# Patient Record
Sex: Female | Born: 1975 | Race: White | Hispanic: No | Marital: Married | Smoking: Never smoker
Health system: Southern US, Community
[De-identification: ages and names within clinical notes are randomized; demographics above are authoritative.]

## PROBLEM LIST (undated history)

## (undated) DIAGNOSIS — F419 Anxiety disorder, unspecified: Secondary | ICD-10-CM

## (undated) HISTORY — PX: OTHER SURGICAL HISTORY: SHX169

## (undated) HISTORY — PX: NASAL POLYP SURGERY: SHX186

---

## 2011-04-09 ENCOUNTER — Encounter: Payer: Self-pay | Admitting: Emergency Medicine

## 2011-04-09 ENCOUNTER — Emergency Department
Admission: EM | Admit: 2011-04-09 | Discharge: 2011-04-09 | Disposition: A | Discharge status: Home / Self Care | Payer: BC Managed Care – PPO | Source: Home / Self Care | Attending: Emergency Medicine | Admitting: Emergency Medicine

## 2011-04-09 DIAGNOSIS — J329 Chronic sinusitis, unspecified: Secondary | ICD-10-CM

## 2011-04-09 HISTORY — DX: Anxiety disorder, unspecified: F41.9

## 2011-04-09 LAB — POCT RAPID STREP A (OFFICE): Rapid Strep A Screen: NEGATIVE

## 2011-04-09 MED ORDER — AMOXICILLIN 875 MG PO TABS: 875.0000 mg | ORAL_TABLET | Freq: Two times a day (BID) | ORAL | Status: AC

## 2011-04-09 NOTE — ED Provider Notes (Signed)
History     CSN: [id number]  Arrival date & time 04/09/11  1228   First MD Initiated Contact with Patient 04/09/11 1307      Chief Complaint  Patient presents with  . Nasal Congestion    (Consider location/radiation/quality/duration/timing/severity/associated sxs/prior treatment) HPI Paula Tucker is a 36 y.o. female who complains of onset of cold symptoms for 7 days.  The symptoms are constant and mild-moderate in severity.  She already has baseline allergies, however she states she is getting worse in this does not feel like allergies but instead feels like a sinus infection.  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 + sore throat + cough + hoarseness No pleuritic pain No wheezing + nasal congestion + post-nasal drainage ++ sinus pain/pressure No chest congestion No itchy/red eyes No earache No hemoptysis No SOB +/- chills/sweats No fever No nausea No vomiting No abdominal pain No diarrhea No skin rashes No fatigue No myalgias + headache    Past Medical History  Diagnosis Date  . Asthma   . Anxiety     Past Surgical History  Procedure Date  . Cyst on neck     Family History  Problem Relation Age of Onset  . Heart failure Mother     History  Substance Use Topics  . Smoking status: Never Smoker   . Smokeless tobacco: Not on file  . Alcohol Use: No      Review of Systems  All other systems reviewed and are negative.    Allergies  Review of patient's allergies indicates no known allergies.  Home Medications   Current Outpatient Rx  Name Route Sig Dispense Refill  . CLONAZEPAM 0.5 MG PO TABS Oral Take 0.5 mg by mouth 2 (two) times daily as needed.    Marland Kitchen FLUTICASONE-SALMETEROL 100-50 MCG/DOSE IN AEPB Inhalation Inhale 1 puff into the lungs every 12 (twelve) hours.    Kathrynn Running ESTRAD TRIPHASIC 0.5/0.75/1-35 MG-MCG PO TABS Oral Take 1 tablet by mouth daily.    Marland Kitchen PAROXETINE HCL 20 MG PO TABS Oral Take 20 mg  by mouth every morning.    Marland Kitchen AMOXICILLIN 875 MG PO TABS Oral Take 1 tablet (875 mg total) by mouth 2 (two) times daily. 16 tablet 0    BP 131/88  Temp(Src) 99 F (37.2 C) (Oral)  Resp 18  Ht 5\' 1"  (1.549 m)  Wt 190 lb (86.183 kg)  BMI 35.90 kg/m2  SpO2 98%  Physical Exam  Nursing note and vitals reviewed. Constitutional: He is oriented to person, place, and time. He appears well-developed and well-nourished.  HENT:  Head: Normocephalic and atraumatic.  Right Ear: Tympanic membrane, external ear and ear canal normal.  Left Ear: Tympanic membrane, external ear and ear canal normal.  Nose: Mucosal edema and rhinorrhea present. Right sinus exhibits maxillary sinus tenderness. Left sinus exhibits maxillary sinus tenderness.  Mouth/Throat: Posterior oropharyngeal erythema (mild clear PND) present. No oropharyngeal exudate or posterior oropharyngeal edema.  Eyes: No scleral icterus.  Neck: Neck supple.  Cardiovascular: Regular rhythm and normal heart sounds.   Pulmonary/Chest: Effort normal and breath sounds normal. No respiratory distress.  Neurological: He is alert and oriented to person, place, and time.  Skin: Skin is warm and dry.  Psychiatric: He has a normal mood and affect. His speech is normal.    ED Course  Procedures (including critical care time)   Labs Reviewed  POCT RAPID STREP A (OFFICE)   No results  found.   1. Sinusitis       MDM  1)  Take the prescribed antibiotic as instructed. 2)  Use nasal saline solution (over the counter) at least 3 times a day. 3)  Use over the counter decongestants like Zyrtec-D every 12 hours as needed to help with congestion.  If you have hypertension, do not take medicines with sudafed.  4)  Can take tylenol every 6 hours or motrin every 8 hours for pain or fever. 5)  Follow up with your primary doctor if no improvement in 5-7 days, sooner if increasing pain, fever, or new symptoms.     Marlaine Hind, MD 04/09/11 1330

## 2011-04-09 NOTE — ED Notes (Signed)
Sinus congestion with fever, fatigue, cough and sore throat.

## 2011-06-11 ENCOUNTER — Ambulatory Visit (INDEPENDENT_AMBULATORY_CARE_PROVIDER_SITE_OTHER): Payer: BC Managed Care – PPO | Admitting: Physician Assistant

## 2011-06-11 ENCOUNTER — Encounter: Payer: Self-pay | Admitting: Physician Assistant

## 2011-06-11 VITALS — BP 140/94 | HR 99 | Temp 98.5°F | Wt 192.0 lb

## 2011-06-11 DIAGNOSIS — F341 Dysthymic disorder: Secondary | ICD-10-CM

## 2011-06-11 DIAGNOSIS — R635 Abnormal weight gain: Secondary | ICD-10-CM

## 2011-06-11 DIAGNOSIS — Z13220 Encounter for screening for lipoid disorders: Secondary | ICD-10-CM

## 2011-06-11 DIAGNOSIS — R030 Elevated blood-pressure reading, without diagnosis of hypertension: Secondary | ICD-10-CM

## 2011-06-11 DIAGNOSIS — J45909 Unspecified asthma, uncomplicated: Secondary | ICD-10-CM

## 2011-06-11 DIAGNOSIS — F419 Anxiety disorder, unspecified: Secondary | ICD-10-CM

## 2011-06-11 DIAGNOSIS — Z131 Encounter for screening for diabetes mellitus: Secondary | ICD-10-CM

## 2011-06-11 MED ORDER — FLUTICASONE-SALMETEROL 100-50 MCG/DOSE IN AEPB: 1.0000 | INHALATION_SPRAY | Freq: Two times a day (BID) | RESPIRATORY_TRACT | Status: DC

## 2011-06-11 MED ORDER — ALBUTEROL SULFATE HFA 108 (90 BASE) MCG/ACT IN AERS: 2.0000 | INHALATION_SPRAY | Freq: Four times a day (QID) | RESPIRATORY_TRACT | Status: DC | PRN

## 2011-06-11 NOTE — Patient Instructions (Addendum)
Will watch blood pressure. Start exercising goal of a week making sure that you sweat and get your heart rate up. Will recheck in 1-2 months with CPE and blood pressure recheck. Will get labs now. Fasting at least 8 hours.

## 2011-06-11 NOTE — Progress Notes (Signed)
  Subjective:    Patient ID: Paula Tucker, female    DOB: 02/05/1975, 36 y.o.   MRN: [id number]  HPI Patient presents to the clinic to establish care and manage asthma. PMH reviewed. She does have history of one seizure but it was never determined the type. She has not had seizure since first episode.  She does have anxiety and depression but her psychiatrist has been tapering her off paxil and klonapin. She is feeling really good. Needs CPE and blood work. Will give lab order form today. Not exercising but trying to make better diet choices and still feels like no changes yield in weight loss. Has not felt fatigue or have any heat or cold intolerances.   NO problems with blood pressure in the past. Denies CP, palpitations, or headaches.   Not been taking advair because have not had prescription. She has really struggled to breathe the last few weeks. Advair was helping. Symptoms without advair were almost daily and worse at night when laying down. Not had testing recently. She been wheezing and lot of SOB. Has not had rescue inhaler to use. She has been using OTC asthma relief a epinephrine blend to help with breathing it has helped some.       Review of Systems     Objective:   Physical Exam  Constitutional: He is oriented to person, place, and time. He appears well-developed and well-nourished.  HENT:  Head: Normocephalic and atraumatic.  Cardiovascular: Normal rate, regular rhythm and normal heart sounds.   Pulmonary/Chest: Effort normal.       Wheezing heard at base of left lung. Normal right lung.   Neurological: He is alert and oriented to person, place, and time.  Skin: Skin is warm and dry.  Psychiatric: He has a normal mood and affect. His behavior is normal.          Assessment & Plan:  Asthma- Not been taking advair. Refilled advair and advised patient to start taking as directed twice a day. Talked about need for spironmetry. Will need to make separate appt for that. Gave  rx for albuterol inhaler to use as needed 2puffs. Discuss proper inhaler technique. Will recheck symptom control at CPE in 1-2 months.   Elevated blood pressure- Will continue to watch blood pressure. Not a history of being this elevated. Encouraged patient to watch salt intake and consider exercise to decrease blood pressure. Will recheck at CPE in 1-2 months.   Anxiety and depression- doing well tapering off. Will let psychiatrist manage taper. Informed to call if any changes with mood.   Weight gain- will get TSH. Start a goal of exercising a week. Encouraged good choices with food. Eliminate beverages from calories. Going off Paxil may actually help you lose weight.   Gave form to get labs drawn for CPE. Discussed needed to be fasting for 8 hours.

## 2011-06-14 DIAGNOSIS — F329 Major depressive disorder, single episode, unspecified: Secondary | ICD-10-CM | POA: Insufficient documentation

## 2011-06-14 DIAGNOSIS — J45909 Unspecified asthma, uncomplicated: Secondary | ICD-10-CM | POA: Insufficient documentation

## 2011-07-09 ENCOUNTER — Encounter: Payer: BC Managed Care – PPO | Admitting: Physician Assistant

## 2011-07-27 LAB — LIPID PANEL
HDL: 53 mg/dL (ref >39)
LDL Cholesterol: 88 mg/dL (ref 0–99)
Total CHOL/HDL Ratio: 3.2 Ratio
VLDL: 26 mg/dL (ref 0–40)

## 2011-07-27 LAB — COMPLETE METABOLIC PANEL WITH GFR
ALT: 14 U/L (ref 0–53)
AST: 15 U/L (ref 0–37)
Albumin: 3.8 g/dL (ref 3.5–5.2)
Alkaline Phosphatase: 58 U/L (ref 39–117)
GFR, Est Non African American: >89 mL/min
Potassium: 4.5 mEq/L (ref 3.5–5.3)
Sodium: 139 mEq/L (ref 135–145)
Total Bilirubin: 0.2 mg/dL — ABNORMAL LOW (ref 0.3–1.2)
Total Protein: 7.2 g/dL (ref 6.0–8.3)

## 2011-07-28 ENCOUNTER — Encounter: Payer: Self-pay | Admitting: Physician Assistant

## 2011-07-28 ENCOUNTER — Ambulatory Visit (INDEPENDENT_AMBULATORY_CARE_PROVIDER_SITE_OTHER): Payer: BC Managed Care – PPO | Admitting: Physician Assistant

## 2011-07-28 VITALS — BP 147/92 | HR 97 | Ht 61.0 in | Wt 194.0 lb

## 2011-07-28 DIAGNOSIS — F419 Anxiety disorder, unspecified: Secondary | ICD-10-CM

## 2011-07-28 DIAGNOSIS — I10 Essential (primary) hypertension: Secondary | ICD-10-CM

## 2011-07-28 DIAGNOSIS — Z0000 Encounter for general adult medical examination without abnormal findings: Secondary | ICD-10-CM

## 2011-07-28 DIAGNOSIS — Z23 Encounter for immunization: Secondary | ICD-10-CM

## 2011-07-28 MED ORDER — LISINOPRIL-HYDROCHLOROTHIAZIDE 10-12.5 MG PO TABS: 1.0000 | ORAL_TABLET | Freq: Every day | ORAL | Status: DC

## 2011-07-28 NOTE — Patient Instructions (Addendum)
Recommend Calcium 500mg  twice day or 4 servings.  Exercise a week. Balanced diet. Follow up in 1 year.  Tdap was given.   Start lisinopril 10/12.5 once daily. Follow up in 1 month.  Will need spirometry sometime this year.

## 2011-07-29 DIAGNOSIS — I10 Essential (primary) hypertension: Secondary | ICD-10-CM | POA: Insufficient documentation

## 2011-07-29 NOTE — Addendum Note (Signed)
Addended by: Jomarie Longs on: 07/29/2011 11:27 PM   Modules accepted: Orders

## 2011-07-29 NOTE — Progress Notes (Addendum)
  Subjective:     Paula Tucker is a 36 y.o. female and is here for a comprehensive physical exam. The patient reports no problems. She is slowly tapering off paxil under the cousel of neurologist. She would like to be referred somewhere for CBT so that she can make sure she is prepare for any worsening anxiety while coming off medication. Pt feels great today.  History   Social History  . Marital Status: Married    Spouse Name: N/A    Number of Children: N/A  . Years of Education: N/A   Occupational History  . Not on file.   Social History Main Topics  . Smoking status: Never Smoker   . Smokeless tobacco: Not on file  . Alcohol Use: No  . Drug Use: No  . Sexually Active: Yes    Birth Control/ Protection: Pill   Other Topics Concern  . Not on file   Social History Narrative  . No narrative on file   Health Maintenance  Topic Date Due  . Pap Smear  11/15/1995  . Influenza Vaccine  10/12/2011  . Tetanus/tdap  07/27/2021    The following portions of the patient's history were reviewed and updated as appropriate: allergies, current medications, past family history, past medical history, past social history, past surgical history and problem list.  Review of Systems A comprehensive review of systems was negative.   Objective:    BP 147/92  Pulse 97  Ht 5\' 1"  (1.549 m)  Wt 194 lb (87.998 kg)  BMI 36.66 kg/m2  SpO2 97%  LMP 07/04/2011 General appearance: alert, cooperative, appears stated age and mildly obese Head: Normocephalic, without obvious abnormality, atraumatic Eyes: conjunctivae/corneas clear. PERRL, EOM's intact. Fundi benign. Ears: normal TM's and external ear canals both ears Nose: Nares normal. Septum midline. Mucosa normal. No drainage or sinus tenderness. Throat: lips, mucosa, and tongue normal; teeth and gums normal Neck: no adenopathy, no carotid bruit, no JVD, supple, symmetrical, trachea midline and thyroid not enlarged, symmetric, no  tenderness/mass/nodules Back: symmetric, no curvature. ROM normal. No CVA tenderness. Lungs: clear to auscultation bilaterally Heart: regular rate and rhythm, S1, S2 normal, no murmur, click, rub or gallop Abdomen: soft, non-tender; bowel sounds normal; no masses,  no organomegaly Extremities: extremities normal, atraumatic, no cyanosis or edema Pulses: 2+ and symmetric Skin: Skin color, texture, turgor normal. No rashes or lesions Lymph nodes: Cervical, supraclavicular, and axillary nodes normal. Neurologic: Grossly normal    Assessment:    Healthy female exam.    Plan:    CPE- Recommend Calcium 500mg  twice day or 4 servings.  Exercise a week. Balanced diet. Follow up in 1 year.  Tdap was given. Vaccines up to date.  Start lisinopril 10/12.5 once daily. Follow up in 1 month.  Will need spirometry sometime this year. Continue on advair daily.   Will refer to CBT for anxiety and depression. See After Visit Summary for Counseling Recommendations

## 2011-08-09 ENCOUNTER — Ambulatory Visit (HOSPITAL_COMMUNITY): Payer: BC Managed Care – PPO | Admitting: Psychology

## 2011-08-20 ENCOUNTER — Encounter: Payer: Self-pay | Admitting: Physician Assistant

## 2011-08-20 ENCOUNTER — Ambulatory Visit (INDEPENDENT_AMBULATORY_CARE_PROVIDER_SITE_OTHER): Payer: BC Managed Care – PPO | Admitting: Physician Assistant

## 2011-08-20 VITALS — BP 104/74 | HR 107 | Ht 61.0 in | Wt 193.0 lb

## 2011-08-20 DIAGNOSIS — J45909 Unspecified asthma, uncomplicated: Secondary | ICD-10-CM

## 2011-08-20 DIAGNOSIS — I10 Essential (primary) hypertension: Secondary | ICD-10-CM

## 2011-08-20 MED ORDER — LISINOPRIL 10 MG PO TABS: 10.0000 mg | ORAL_TABLET | Freq: Every day | ORAL | Status: DC

## 2011-08-20 NOTE — Patient Instructions (Addendum)
F/U 6 months

## 2011-08-23 NOTE — Progress Notes (Signed)
  Subjective:    Patient ID: Paula Tucker, female    DOB: 1975-09-27, 36 y.o.   MRN: [id number]  HPI Patient comes in today for spironmetry since we have no record in 2+years for her asthma. She denies any worsening of asthma symptoms today. She rarely uses albuterol inhaler maybe once a month. She is using Advair every day and doing well.   She is almost off her paxil for anxiety and depression that is being managed by neurologist. She is doing well today and feels like symptoms are under control. She still has the occasional overwhelmed feeling but she is able to get through it with behavioral techniques.   Pt is taking lisinopril/HCTZ daily since recent dx of hypertension. Today her blood pressure is low. She reports feeling dizzy sometimes when she stands. She is trying to exercise more and make better dietary choices. She denies any side effects of Lisinopril/HCTZ.    Review of Systems     Objective:   Physical Exam  Constitutional: She is oriented to person, place, and time. She appears well-developed and well-nourished.  HENT:  Head: Normocephalic and atraumatic.  Cardiovascular: Normal rate, regular rhythm, normal heart sounds and intact distal pulses.   Pulmonary/Chest: Effort normal and breath sounds normal. She has no wheezes.  Neurological: She is alert and oriented to person, place, and time.  Skin: Skin is warm and dry.  Psychiatric: She has a normal mood and affect. Her behavior is normal.          Assessment & Plan:  Asthma- spironmetry was done today and was normal before but FVC improved by more than 12% and thus is significant after medication. Will leave on same treatment. Follow up as needed or in 6 months for recheck.  Hypertension- BP is very low today. Will change to lisinopril 10mg  daily and remove diuretic. Patient is to monitor her blood pressure at home and make sure it is staying above 100/75 and below 140/90. Will follow up in 6 months. Also discussed if  she still remained dizzy while taking bp medication we may to decrease dose again and she would need to call into office.

## 2011-08-30 ENCOUNTER — Ambulatory Visit: Payer: BC Managed Care – PPO | Admitting: Physician Assistant

## 2011-08-30 ENCOUNTER — Other Ambulatory Visit: Payer: Self-pay | Admitting: Physician Assistant

## 2011-09-07 ENCOUNTER — Encounter: Payer: Self-pay | Admitting: Physician Assistant

## 2011-09-14 ENCOUNTER — Other Ambulatory Visit: Payer: Self-pay | Admitting: *Deleted

## 2011-09-14 MED ORDER — FLUTICASONE-SALMETEROL 100-50 MCG/DOSE IN AEPB: 1.0000 | INHALATION_SPRAY | Freq: Two times a day (BID) | RESPIRATORY_TRACT | Status: DC

## 2011-12-13 ENCOUNTER — Other Ambulatory Visit: Payer: Self-pay | Admitting: Physician Assistant

## 2012-01-28 ENCOUNTER — Encounter: Payer: Self-pay | Admitting: Physician Assistant

## 2012-01-28 ENCOUNTER — Ambulatory Visit (INDEPENDENT_AMBULATORY_CARE_PROVIDER_SITE_OTHER): Payer: BC Managed Care – PPO | Admitting: Physician Assistant

## 2012-01-28 VITALS — BP 152/84 | HR 103 | Wt 190.0 lb

## 2012-01-28 DIAGNOSIS — R51 Headache: Secondary | ICD-10-CM

## 2012-01-28 DIAGNOSIS — H538 Other visual disturbances: Secondary | ICD-10-CM

## 2012-01-28 MED ORDER — PAROXETINE HCL 20 MG PO TABS: 20.0000 mg | ORAL_TABLET | ORAL | Status: DC

## 2012-01-28 MED ORDER — GABAPENTIN 100 MG PO CAPS: ORAL_CAPSULE | ORAL | Status: DC

## 2012-01-28 MED ORDER — SUMATRIPTAN SUCCINATE 100 MG PO TABS: 100.0000 mg | ORAL_TABLET | ORAL | Status: DC | PRN

## 2012-01-28 MED ORDER — CLONAZEPAM 0.5 MG PO TABS: 0.5000 mg | ORAL_TABLET | Freq: Two times a day (BID) | ORAL | Status: DC | PRN

## 2012-01-28 MED ORDER — FLUTICASONE PROPIONATE 50 MCG/ACT NA SUSP: 2.0000 | Freq: Every day | NASAL | Status: DC

## 2012-01-28 NOTE — Patient Instructions (Addendum)
Keep an eye on blood pressure if goes above 140/90 give office a call.   Go back up on paxil.   Start gabapentin at night can take up to 2 tabs.   Imitrex at onset of headache or vision blurrnes.

## 2012-01-28 NOTE — Progress Notes (Signed)
Subjective:    Patient ID: Paula Tucker, female    DOB: 02-10-1975, 37 y.o.   MRN: [id number]  HPI  Patient is 37 yo female who presents to the clinic to follow up from Hospital. She was sent to hospital from her work because she reported headache, dizziness, speech slurring, and mental fogginess. She is a Runner, broadcasting/film/video and describe that it started with her vision being a little blurred and then an headache started. She took advil and she just felt "out of it". She was not making sense verbally. Her speech was slurred and she felt weak all over. Headache was around circumference of head and at the temples. Pain scale was 4/10. She denies any N/VD, ear pain, tinnitus. She denies any muscle weakness, LOC, or passing out. 2 weeks ago her vision was blurred again while reading but stopped reading and symptoms did not progress. In November the exact same thing happened but lasted a shorter peroid of time about 30 minutes. Labs in ER: MRA of head normal. CBC normal. Urine normal. SCr a alittle low and gave fluids for dehydration.  ER discharged once feeling better after IV and benadryl.  Pt concerned because she is trying to come off paxil and klonapin. She is currently on 1/2 tab of both. She has been on this dose for over 3 months at this point. She is feeling very anxious. She is having hard time sleeping and her legs twich and jump at night. Not when falling asleep but also in early morning hours. She leaves in a state of on and off again congestion per pt. Dose not take anything for it.     Review of Systems  Constitutional: Negative for fever, chills, appetite change, fatigue and unexpected weight change.  HENT: Positive for congestion.   Eyes: Negative.   Respiratory: Negative.   Cardiovascular: Negative.   Gastrointestinal: Negative.   Musculoskeletal: Negative.   Skin: Negative.   Hematological: Negative.        Objective:   Physical Exam  Constitutional: She is oriented to person, place, and  time. She appears well-developed and well-nourished.  HENT:  Head: Normocephalic and atraumatic.  Right Ear: External ear normal.  Left Ear: External ear normal.  Nose: Nose normal.  Mouth/Throat: Oropharynx is clear and moist.       TM's clear.  Eyes: Conjunctivae normal are normal.  Neck: Normal range of motion. Neck supple.  Cardiovascular: Normal rate, regular rhythm and normal heart sounds.   Pulmonary/Chest: Effort normal and breath sounds normal. She has no wheezes.  Musculoskeletal:       Strength of upper and lower extremities 5/5.   Lymphadenopathy:    She has no cervical adenopathy.  Neurological: She is alert and oriented to person, place, and time. She has normal reflexes. No cranial nerve deficit. Coordination normal.       Negative Rhombergs sign.  Skin: Skin is warm and dry.  Psychiatric: She has a normal mood and affect. Her behavior is normal.          Assessment & Plan:  Headaches/Dizziness/speech slurring/HTN- Reassured patient that I think this is headaches causing these symptoms. Pt has been seen by neurologist in the past and discharged recently. She does not want to go on medication to prevent headaches because she feels like they do not happen frequently enough. Will give imitrex to start at headache/vision blurrness onset and then once more 2 hours later. Will refer to neurology I think pt should be evaulated for MS  or Lupus. Recheck BP and decreased to 132/80. Continue to monitor to see if we need to increase medication. Will recheck in 4 weeks. Gave flonase to see if helps with headache if due to head congestion. Consider taking zyrtec daily.   Lower extremity leg twicthing- Try neurotin 100mg  at night. May increase to 2 tabs if need too. Make sure not dehydrated. Drink lots of fluids.   Anxiety and Depression- Increase paxil up to 20mg  dose. Keep klonapin as 1/2 tab once a day only as needed. Reducated pt to take when need not as preventative. Follow up in  4 weeks.     Spent 30 minutes with patient and greater than 50 percent of time spent counseling about plan of action with neurologist and treatment plan for anxiety and depression.

## 2012-02-02 ENCOUNTER — Encounter: Payer: Self-pay | Admitting: *Deleted

## 2012-02-21 ENCOUNTER — Other Ambulatory Visit: Payer: Self-pay | Admitting: *Deleted

## 2012-02-21 MED ORDER — LISINOPRIL 10 MG PO TABS: 10.0000 mg | ORAL_TABLET | Freq: Every day | ORAL | Status: DC

## 2012-02-23 ENCOUNTER — Ambulatory Visit: Payer: BC Managed Care – PPO | Admitting: Physician Assistant

## 2012-03-03 ENCOUNTER — Encounter: Payer: Self-pay | Admitting: Physician Assistant

## 2012-03-03 ENCOUNTER — Ambulatory Visit (INDEPENDENT_AMBULATORY_CARE_PROVIDER_SITE_OTHER): Payer: BC Managed Care – PPO | Admitting: Physician Assistant

## 2012-03-03 VITALS — BP 125/81 | HR 82 | Wt 193.0 lb

## 2012-03-03 DIAGNOSIS — I10 Essential (primary) hypertension: Secondary | ICD-10-CM

## 2012-03-03 MED ORDER — PAROXETINE HCL 20 MG PO TABS: 20.0000 mg | ORAL_TABLET | ORAL | Status: DC

## 2012-03-03 MED ORDER — FLUTICASONE-SALMETEROL 100-50 MCG/DOSE IN AEPB: 1.0000 | INHALATION_SPRAY | Freq: Two times a day (BID) | RESPIRATORY_TRACT | Status: DC

## 2012-03-03 MED ORDER — LISINOPRIL 10 MG PO TABS: 10.0000 mg | ORAL_TABLET | Freq: Every day | ORAL | Status: DC

## 2012-03-03 NOTE — Progress Notes (Signed)
  Subjective:    Patient ID: Paula Tucker, female    DOB: February 05, 1975, 37 y.o.   MRN: [id number]  HPI Patient presents to the clinic to follow up on HTN. Pt doing well. Denies any CP, palpitations, numbness and tingling, vision changes. She occasionally will have headache that is control with Imitrex. Still off OCP thinking it may have something to do with HTN. Nothing makes better or worse.   Anxiety and depression is also well controlled on Paxil 20mg . She feels much more stable and happy. Pt's husband can see a huge difference since going up in dosage.   She has not had another episode of speech loss and weakness since January. Neurologist suspects Anxiety/Migraines but still ruling out seizure activity. She is being seen regularly at this point. He did increase klonapin to 1/2 of  .5mg  twice a day.   Review of Systems     Objective:   Physical Exam  Constitutional: She is oriented to person, place, and time. She appears well-developed and well-nourished.  HENT:  Head: Normocephalic and atraumatic.  Cardiovascular: Normal rate, regular rhythm and normal heart sounds.   Pulmonary/Chest: Effort normal and breath sounds normal. She has no wheezes.  Neurological: She is alert and oriented to person, place, and time.  Skin: Skin is warm and dry.  Psychiatric: She has a normal mood and affect. Her behavior is normal.          Assessment & Plan:  HTN- Well controlled today. Stay on lisinopril at this point. As next visit we can consider going back on OCP and monitoring BP to see if there is an increase. Continue low salt diet and regular exercise.follow up in 3 months.   Anxiety and Depression- Refilled Paxil. Use klonapin as neurologist directed. If release from neurologist more than happy to monitor care. Continue to stay on same dose.   Migraines- Continue to monitor and use imitrex as needed.   Needs CPE in may.

## 2012-03-03 NOTE — Patient Instructions (Addendum)
Follow up in 3 months

## 2012-03-20 ENCOUNTER — Other Ambulatory Visit: Payer: Self-pay | Admitting: Physician Assistant

## 2012-04-19 ENCOUNTER — Other Ambulatory Visit: Payer: Self-pay | Admitting: Physician Assistant

## 2012-05-08 ENCOUNTER — Other Ambulatory Visit: Payer: Self-pay | Admitting: Physician Assistant

## 2012-06-14 ENCOUNTER — Other Ambulatory Visit: Payer: Self-pay | Admitting: Physician Assistant

## 2012-06-28 ENCOUNTER — Other Ambulatory Visit: Payer: Self-pay | Admitting: Physician Assistant

## 2012-07-24 ENCOUNTER — Other Ambulatory Visit: Payer: Self-pay | Admitting: *Deleted

## 2012-07-24 MED ORDER — CLONAZEPAM 0.5 MG PO TABS: ORAL_TABLET | ORAL | Status: DC

## 2012-08-02 ENCOUNTER — Encounter: Payer: Self-pay | Admitting: Physician Assistant

## 2012-08-02 ENCOUNTER — Other Ambulatory Visit (HOSPITAL_COMMUNITY)
Admission: RE | Admit: 2012-08-02 | Discharge: 2012-08-02 | Disposition: A | Discharge status: Home / Self Care | Payer: BC Managed Care – PPO | Source: Ambulatory Visit | Attending: Family Medicine | Admitting: Family Medicine

## 2012-08-02 ENCOUNTER — Ambulatory Visit (INDEPENDENT_AMBULATORY_CARE_PROVIDER_SITE_OTHER): Payer: BC Managed Care – PPO | Admitting: Physician Assistant

## 2012-08-02 VITALS — BP 135/87 | HR 120 | Wt 192.0 lb

## 2012-08-02 DIAGNOSIS — Z13220 Encounter for screening for lipoid disorders: Secondary | ICD-10-CM

## 2012-08-02 DIAGNOSIS — Z01419 Encounter for gynecological examination (general) (routine) without abnormal findings: Secondary | ICD-10-CM | POA: Insufficient documentation

## 2012-08-02 DIAGNOSIS — Z124 Encounter for screening for malignant neoplasm of cervix: Secondary | ICD-10-CM

## 2012-08-02 DIAGNOSIS — Z131 Encounter for screening for diabetes mellitus: Secondary | ICD-10-CM

## 2012-08-02 DIAGNOSIS — Z1151 Encounter for screening for human papillomavirus (HPV): Secondary | ICD-10-CM | POA: Insufficient documentation

## 2012-08-02 DIAGNOSIS — F419 Anxiety disorder, unspecified: Secondary | ICD-10-CM

## 2012-08-02 DIAGNOSIS — I10 Essential (primary) hypertension: Secondary | ICD-10-CM

## 2012-08-02 DIAGNOSIS — R000 Tachycardia, unspecified: Secondary | ICD-10-CM

## 2012-08-02 DIAGNOSIS — Z0000 Encounter for general adult medical examination without abnormal findings: Secondary | ICD-10-CM

## 2012-08-02 MED ORDER — LISINOPRIL 10 MG PO TABS: 10.0000 mg | ORAL_TABLET | Freq: Every day | ORAL | Status: DC

## 2012-08-02 MED ORDER — FLUTICASONE-SALMETEROL 100-50 MCG/DOSE IN AEPB: 1.0000 | INHALATION_SPRAY | Freq: Two times a day (BID) | RESPIRATORY_TRACT | Status: DC

## 2012-08-02 MED ORDER — PAROXETINE HCL 20 MG PO TABS: 20.0000 mg | ORAL_TABLET | ORAL | Status: DC

## 2012-08-02 MED ORDER — CLONAZEPAM 0.5 MG PO TABS: ORAL_TABLET | ORAL | Status: DC

## 2012-08-02 NOTE — Progress Notes (Signed)
  Subjective:     Paula Tucker is a 37 y.o. female and is here for a comprehensive physical exam. The patient reports no problems.  History   Social History  . Marital Status: Married    Spouse Name: N/A    Number of Children: N/A  . Years of Education: N/A   Occupational History  . Not on file.   Social History Main Topics  . Smoking status: Never Smoker   . Smokeless tobacco: Not on file  . Alcohol Use: No  . Drug Use: No  . Sexually Active: Yes    Birth Control/ Protection: Pill   Other Topics Concern  . Not on file   Social History Narrative  . No narrative on file   Health Maintenance  Topic Date Due  . Influenza Vaccine  09/11/2012  . Pap Smear  01/28/2013  . Tetanus/tdap  07/27/2021    The following portions of the patient's history were reviewed and updated as appropriate: allergies, current medications, past family history, past medical history, past social history, past surgical history and problem list.  Review of Systems A comprehensive review of systems was negative.   Objective:    BP 135/87  Pulse 120  Wt 192 lb (87.091 kg)  BMI 36.3 kg/m2  LMP 07/25/2012 General appearance: alert, cooperative and appears stated age Head: Normocephalic, without obvious abnormality, atraumatic Eyes: conjunctivae/corneas clear. PERRL, EOM's intact. Fundi benign. Ears: normal TM's and external ear canals both ears Nose: Nares normal. Septum midline. Mucosa normal. No drainage or sinus tenderness. Throat: lips, mucosa, and tongue normal; teeth and gums normal Neck: no adenopathy, no carotid bruit, no JVD, supple, symmetrical, trachea midline and thyroid not enlarged, symmetric, no tenderness/mass/nodules Back: symmetric, no curvature. ROM normal. No CVA tenderness. Lungs: clear to auscultation bilaterally Breasts: normal appearance, no masses or tenderness Heart: regular rate and rhythm, S1, S2 normal, no murmur, click, rub or gallop and tachycardia at 112 at  recheck. Abdomen: soft, non-tender; bowel sounds normal; no masses,  no organomegaly Pelvic: cervix normal in appearance, external genitalia normal, no adnexal masses or tenderness, no cervical motion tenderness, uterus normal size, shape, and consistency and vagina normal without discharge Extremities: extremities normal, atraumatic, no cyanosis or edema Pulses: 2+ and symmetric Skin: Skin color, texture, turgor normal. No rashes or lesions Lymph nodes: Cervical, supraclavicular, and axillary nodes normal. Neurologic: Grossly normal    Assessment:    Healthy female exam.      Plan:    CPE/Tachycardia- pap done will call with results. Vaccines up to date. Gave handout with calcium/vit D recommendations. Discussed healthy diet and regular exercise. Pt declined lipid to be tested. Last year they were great. She said will do next year. HR was elevated today. Denies any palpations. Would like to recheck in 1-2 months. Check and monitor at home. Goal being under 100. Discussed decreasing caffiene, chocolate, alcohol to help HR. Follow up with any symptoms. May need to consider a BB.   Anxiety- Paxil and klonapin refilled for 6 months.   HTN- refilled lisinopril See After Visit Summary for Counseling Recommendations

## 2012-08-02 NOTE — Patient Instructions (Addendum)
Keeping You Healthy  Get These Tests 1. Blood Pressure- Have your blood pressure checked once a year by your health care provider.  Normal blood pressure is 120/80. 2. Weight- Have your body mass index (BMI) calculated to screen for obesity.  BMI is measure of body fat based on height and weight.  You can also calculate your own BMI at https://www.west-esparza.com/. 3. Cholesterol- Have your cholesterol checked every 5 years starting at age 35 then yearly starting at age 61. 4. Chlamydia, HIV, and other sexually transmitted diseases- Get screened every year until age 74, then within three months of each new sexual provider. 5. Pap Smear- Every 1-3 years; discuss with your health care provider. 6. Mammogram- Every year starting at age 69  Take these medicines  Calcium with Vitamin D-Your body needs 1200 mg of Calcium each day and [id number] IU of Vitamin D daily.  Your body can only absorb 500 mg of Calcium at a time so Calcium must be taken in 2 or 3 divided doses throughout the day.  Multivitamin with folic acid- Once daily if it is possible for you to become pregnant.   immunizations  Tetanus shot- Every 10 years.  Flu shot-Every year.  Take these steps 1. Do not smoke-Your healthcare provider can help you quit.  For tips on how to quit go to www.smokefree.gov or call 1-800 QUITNOW. 2. Be physically active- Exercise 5 days a week for at least 30 minutes.  If you are not already physically active, start slow and gradually work up to 30 minutes of moderate physical activity.  Examples of moderate activity include walking briskly, dancing, swimming, bicycling, etc. 3. Breast Cancer- A self breast exam every month is important for early detection of breast cancer.  For more information and instruction on self breast exams, ask your healthcare provider or [email or web address]. 4. Eat a healthy diet- Eat a variety of healthy foods such as fruits, vegetables, whole grains,  low fat milk, low fat cheeses, yogurt, lean meats, poultry and fish, beans, nuts, tofu, etc.  For more information go to www. Thenutritionsource.org 5. Drink alcohol in moderation- Limit alcohol intake to one drink or less per day. Never drink and drive. 6. Depression- Your emotional health is as important as your physical health.  If you're feeling down or losing interest in things you normally enjoy please talk to your healthcare provider about being screened for depression. 7. Dental visit- Brush and floss your teeth twice daily; visit your dentist twice a year. 8. Eye doctor- Get an eye exam at least every 2 years. 9. Helmet use- Always wear a helmet when riding a bicycle, motorcycle, rollerblading or skateboarding. 10. Safe sex- If you may be exposed to sexually transmitted infections, use a condom. 11. Seat belts- Seat belts can save your live; always wear one. 12. Smoke/Carbon Monoxide detectors- These detectors need to be installed on the appropriate level of your home. Replace batteries at least once a year. 13. Skin cancer- When out in the sun please cover up and use sunscreen 15 SPF or higher. 14. Violence- If anyone is threatening or hurting you, please tell your healthcare provider.

## 2012-10-04 ENCOUNTER — Ambulatory Visit (INDEPENDENT_AMBULATORY_CARE_PROVIDER_SITE_OTHER): Payer: BC Managed Care – PPO | Admitting: Physician Assistant

## 2012-10-04 ENCOUNTER — Encounter: Payer: Self-pay | Admitting: Physician Assistant

## 2012-10-04 VITALS — BP 113/68 | HR 90 | Wt 195.0 lb

## 2012-10-04 DIAGNOSIS — R000 Tachycardia, unspecified: Secondary | ICD-10-CM

## 2012-10-04 NOTE — Progress Notes (Signed)
  Subjective:    Patient ID: Paula Tucker, female    DOB: 18-Sep-1975, 37 y.o.   MRN: [id number]  HPI Patient is a 37 year old female who presents to the clinic to followup on tachycardia at physical approximally 2 months ago. Patient declines any symptoms of palpitations since. Patient reports she does not drink a lot of caffeine daily. She has not made any diet changes. She denies any chest pain, shortness of breath or chest pressure.   Review of Systems     Objective:   Physical Exam  Constitutional: She is oriented to person, place, and time. She appears well-developed and well-nourished.  HENT:  Head: Normocephalic and atraumatic.  Cardiovascular: Normal rate, regular rhythm and normal heart sounds.   Pulmonary/Chest: Effort normal and breath sounds normal.  Neurological: She is alert and oriented to person, place, and time.  Skin: Skin is warm and dry.  Psychiatric: She has a normal mood and affect. Her behavior is normal.          Assessment & Plan:  Tachycardia-patient's heart rate was 90 beats per minute today. I am reassured by HR today. Likely HR elevated at CPE due to some outside source. Reassured pt. Follow up as needed.   Pt declined flu shot today.

## 2012-11-27 ENCOUNTER — Other Ambulatory Visit: Payer: Self-pay | Admitting: Physician Assistant

## 2012-12-26 ENCOUNTER — Other Ambulatory Visit: Payer: Self-pay | Admitting: Physician Assistant

## 2013-01-02 ENCOUNTER — Ambulatory Visit (INDEPENDENT_AMBULATORY_CARE_PROVIDER_SITE_OTHER): Payer: BC Managed Care – PPO | Admitting: Physician Assistant

## 2013-01-02 ENCOUNTER — Encounter: Payer: Self-pay | Admitting: Physician Assistant

## 2013-01-02 VITALS — BP 135/89 | HR 92 | Temp 98.7°F | Wt 199.0 lb

## 2013-01-02 DIAGNOSIS — J209 Acute bronchitis, unspecified: Secondary | ICD-10-CM

## 2013-01-02 MED ORDER — AZITHROMYCIN 250 MG PO TABS: ORAL_TABLET | ORAL | Status: DC

## 2013-01-02 MED ORDER — METHYLPREDNISOLONE SODIUM SUCC 125 MG IJ SOLR
125.0000 mg | Freq: Once | INTRAMUSCULAR | Status: AC
  Administered 2013-01-02: 125 mg via INTRAMUSCULAR

## 2013-01-02 NOTE — Patient Instructions (Signed)
Acute Bronchitis Bronchitis is inflammation of the airways that extend from the windpipe into the lungs (bronchi). The inflammation often causes mucus to develop. This leads to a cough, which is the most common symptom of bronchitis.  In acute bronchitis, the condition usually develops suddenly and goes away over time, usually in a couple weeks. Smoking, allergies, and asthma can make bronchitis worse. Repeated episodes of bronchitis may cause further lung problems.  CAUSES Acute bronchitis is most often caused by the same virus that causes a cold. The virus can spread from person to person (contagious).  SIGNS AND SYMPTOMS   Cough.   Fever.   Coughing up mucus.   Body aches.   Chest congestion.   Chills.   Shortness of breath.   Sore throat.  DIAGNOSIS  Acute bronchitis is usually diagnosed through a physical exam. Tests, such as chest X-rays, are sometimes done to rule out other conditions.  TREATMENT  Acute bronchitis usually goes away in a couple weeks. Often times, no medical treatment is necessary. Medicines are sometimes given for relief of fever or cough. Antibiotics are usually not needed but may be prescribed in certain situations. In some cases, an inhaler may be recommended to help reduce shortness of breath and control the cough. A cool mist vaporizer may also be used to help thin bronchial secretions and make it easier to clear the chest.  HOME CARE INSTRUCTIONS  Get plenty of rest.   Drink enough fluids to keep your urine clear or pale yellow (unless you have a medical condition that requires fluid restriction). Increasing fluids may help thin your secretions and will prevent dehydration.   Only take over-the-counter or prescription medicines as directed by your health care provider.   Avoid smoking and secondhand smoke. Exposure to cigarette smoke or irritating chemicals will make bronchitis worse. If you are a smoker, consider using nicotine gum or skin  patches to help control withdrawal symptoms. Quitting smoking will help your lungs heal faster.   Reduce the chances of another bout of acute bronchitis by washing your hands frequently, avoiding people with cold symptoms, and trying not to touch your hands to your mouth, nose, or eyes.   Follow up with your health care provider as directed.  SEEK MEDICAL CARE IF: Your symptoms do not improve after 1 week of treatment.  SEEK IMMEDIATE MEDICAL CARE IF:  You develop an increased fever or chills.   You have chest pain.   You have severe shortness of breath.  You have bloody sputum.   You develop dehydration.  You develop fainting.  You develop repeated vomiting.  You develop a severe headache. MAKE SURE YOU:   Understand these instructions.  Will watch your condition.  Will get help right away if you are not doing well or get worse. Document Released: 02/05/2004 Document Revised: 08/30/2012 Document Reviewed: 06/20/2012 Exit[employer] Patient Information 2014 [employer].  

## 2013-01-02 NOTE — Progress Notes (Signed)
   Subjective:    Patient ID: Paula Tucker, female    DOB: 1975-05-26, 37 y.o.   MRN: [id number]  HPI Patient presents to the clinic with facial pain, cough and sore throat. Patient reports symptoms started about 3 weeks ago with cold-like symptoms. She thought she was getting better and then 3 days ago everything started worsening. She's had a lot of facial pain bilaterally. She has started coughing more with yellow sputum. She did run a low-grade temperature at her home last night of 100. Today she does not have a temperature. She has not been taking decongestants do to increasing her blood pressure. She has been treating her sore throat with home remedies and taking Tylenol. She has felt very nauseated but not vomited. She has started to wheeze a couple times but denies any significant shortness of breath. She continues to take her Advair daily and use her albuterol as needed.  Review of Systems     Objective:   Physical Exam  Constitutional: She appears well-developed and well-nourished.  HENT:  Head: Normocephalic and atraumatic.  Right Ear: External ear normal.  Left Ear: External ear normal.  Oropharynx erythematous, tonsils are enlarged and postnasal drip present.  TMs clear bilaterally.  Maxillary sinus tenderness to palpation bilaterally.  Pain bilateral nasal turbinates.  Eyes: Conjunctivae are normal.  Clear watery discharge from bilateral eyes.  Neck: Normal range of motion. Neck supple.  Enlarged bilateral anterior cervical lymph nodes.  Cardiovascular: Normal rate, regular rhythm and normal heart sounds.   Pulse was tachycardic at first check. Recheck pulse and was 92 in exam room.  Pulmonary/Chest: Effort normal and breath sounds normal. She has no wheezes.  Skin: Skin is warm and dry.  Psychiatric: She has a normal mood and affect. Her behavior is normal.          Assessment & Plan:  Acute Bronchitis-treated with zpak. Shot of solumedrol 125mg  given in office  today. Continue to use Advair daily to keep asthma component control. Offered cough medication for that time. Patient declined today. Gave handout on other symptomatic care. Continue to use the Flonase.

## 2013-02-05 ENCOUNTER — Other Ambulatory Visit: Payer: Self-pay | Admitting: Physician Assistant

## 2013-02-26 ENCOUNTER — Other Ambulatory Visit: Payer: Self-pay | Admitting: Physician Assistant

## 2013-03-29 ENCOUNTER — Other Ambulatory Visit: Payer: Self-pay | Admitting: Physician Assistant

## 2013-04-23 ENCOUNTER — Other Ambulatory Visit: Payer: Self-pay | Admitting: Physician Assistant

## 2013-05-01 ENCOUNTER — Other Ambulatory Visit: Payer: Self-pay | Admitting: Physician Assistant

## 2013-05-16 ENCOUNTER — Other Ambulatory Visit: Payer: Self-pay | Admitting: Physician Assistant

## 2013-05-28 ENCOUNTER — Other Ambulatory Visit: Payer: Self-pay | Admitting: Physician Assistant

## 2013-05-31 ENCOUNTER — Other Ambulatory Visit: Payer: Self-pay | Admitting: Physician Assistant

## 2013-06-01 ENCOUNTER — Other Ambulatory Visit: Payer: Self-pay | Admitting: Physician Assistant

## 2013-06-15 ENCOUNTER — Other Ambulatory Visit: Payer: Self-pay

## 2013-06-15 ENCOUNTER — Other Ambulatory Visit: Payer: Self-pay | Admitting: Physician Assistant

## 2013-06-15 MED ORDER — CLONAZEPAM 0.5 MG PO TABS: 0.2500 mg | ORAL_TABLET | Freq: Two times a day (BID) | ORAL | Status: DC

## 2013-06-15 NOTE — Telephone Encounter (Signed)
Refilled clonazepam and scheduled patient for follow up.

## 2013-06-18 ENCOUNTER — Other Ambulatory Visit: Payer: Self-pay | Admitting: Physician Assistant

## 2013-06-20 ENCOUNTER — Ambulatory Visit: Payer: BC Managed Care – PPO | Admitting: Physician Assistant

## 2013-06-26 ENCOUNTER — Ambulatory Visit (INDEPENDENT_AMBULATORY_CARE_PROVIDER_SITE_OTHER): Payer: BC Managed Care – PPO | Admitting: Physician Assistant

## 2013-06-26 ENCOUNTER — Encounter: Payer: Self-pay | Admitting: Physician Assistant

## 2013-06-26 VITALS — BP 121/71 | HR 96 | Ht 61.0 in | Wt 209.0 lb

## 2013-06-26 DIAGNOSIS — F329 Major depressive disorder, single episode, unspecified: Secondary | ICD-10-CM

## 2013-06-26 DIAGNOSIS — G43909 Migraine, unspecified, not intractable, without status migrainosus: Secondary | ICD-10-CM

## 2013-06-26 DIAGNOSIS — F419 Anxiety disorder, unspecified: Principal | ICD-10-CM

## 2013-06-26 DIAGNOSIS — I10 Essential (primary) hypertension: Secondary | ICD-10-CM

## 2013-06-26 DIAGNOSIS — J45909 Unspecified asthma, uncomplicated: Secondary | ICD-10-CM

## 2013-06-26 DIAGNOSIS — F341 Dysthymic disorder: Secondary | ICD-10-CM

## 2013-06-26 MED ORDER — PAROXETINE HCL 20 MG PO TABS: ORAL_TABLET | ORAL | Status: DC

## 2013-06-26 MED ORDER — CLONAZEPAM 0.5 MG PO TABS: 0.2500 mg | ORAL_TABLET | Freq: Two times a day (BID) | ORAL | Status: DC

## 2013-06-26 MED ORDER — FLUTICASONE-SALMETEROL 100-50 MCG/DOSE IN AEPB: INHALATION_SPRAY | RESPIRATORY_TRACT | Status: DC

## 2013-06-26 NOTE — Progress Notes (Signed)
   Subjective:    Patient ID: Paula Tucker, female    DOB: 05/17/1975, 38 y.o.   MRN: [id number]  HPI Patient is a 38 year old female who presents to the clinic to followup on medications.  Hypertension-she has not taken her lisinopril and multiple days. She's noticed that her blood pressure remains well-controlled. She would like to go off her blood pressure medication. She denies any headaches, vision changes or palpitations.  Anxiety and depression-patient feels very well controlled on Paxil and Klonopin. She would like refills today. She is a Runner, broadcasting/film/videoteacher and getting ready for summer. She is very happy.  Migraine-she has not had any migraines in the last 6 months. She has Imitrex to use but has not had to use it.  Asthma-well controlled on Advair daily. Has not had to use her rescue inhaler in the past 6 months.   Review of Systems  All other systems reviewed and are negative.      Objective:   Physical Exam  Constitutional: She is oriented to person, place, and time. She appears well-developed and well-nourished.  HENT:  Head: Normocephalic and atraumatic.  Cardiovascular: Normal rate, regular rhythm and normal heart sounds.   Pulmonary/Chest: Effort normal and breath sounds normal. She has no wheezes.  Neurological: She is alert and oriented to person, place, and time.  Psychiatric: She has a normal mood and affect. Her behavior is normal.          Assessment & Plan:  Hypertension-blood pressure is very well controlled today. Will stop lisinopril 10 mg. Encouraged patient to check blood pressure at local pharmacies and if registering above 140/90 to followup. We'll recheck in 6 months.  Anxiety and depression-refilled meds for 6 months. GAD-7 was 1 and PHQ-9 was 1.   Migraine-I. refills needed today. Patient is well controlled.  Asthma-refill on Advair sent to pharmacy for 6 months. Use albuterol rescue inhaler as needed. Followup as needed.

## 2013-06-27 ENCOUNTER — Other Ambulatory Visit: Payer: Self-pay | Admitting: Physician Assistant

## 2013-06-27 LAB — COMPLETE METABOLIC PANEL WITH GFR
ALT: 21 U/L (ref 0–35)
AST: 21 U/L (ref 0–37)
Albumin: 4.1 g/dL (ref 3.5–5.2)
Alkaline Phosphatase: 65 U/L (ref 39–117)
BILIRUBIN TOTAL: 0.3 mg/dL (ref 0.2–1.2)
BUN: 12 mg/dL (ref 6–23)
CO2: 24 mEq/L (ref 19–32)
Calcium: 9.3 mg/dL (ref 8.4–10.5)
Chloride: 104 mEq/L (ref 96–112)
Creat: 0.66 mg/dL (ref 0.50–1.10)
GFR, Est African American: >89 mL/min
GFR, Est Non African American: >89 mL/min
Glucose, Bld: 69 mg/dL — ABNORMAL LOW (ref 70–99)
Potassium: 3.8 mEq/L (ref 3.5–5.3)
Sodium: 138 mEq/L (ref 135–145)
TOTAL PROTEIN: 6.9 g/dL (ref 6.0–8.3)

## 2013-07-25 ENCOUNTER — Other Ambulatory Visit: Payer: Self-pay | Admitting: Physician Assistant

## 2013-12-31 ENCOUNTER — Other Ambulatory Visit: Payer: Self-pay | Admitting: Physician Assistant

## 2014-01-10 ENCOUNTER — Other Ambulatory Visit: Payer: Self-pay | Admitting: Physician Assistant

## 2014-01-25 ENCOUNTER — Ambulatory Visit (INDEPENDENT_AMBULATORY_CARE_PROVIDER_SITE_OTHER): Payer: BC Managed Care – PPO | Admitting: Physician Assistant

## 2014-01-25 ENCOUNTER — Encounter: Payer: Self-pay | Admitting: Physician Assistant

## 2014-01-25 VITALS — BP 138/77 | HR 77 | Ht 61.0 in | Wt 214.0 lb

## 2014-01-25 DIAGNOSIS — F418 Other specified anxiety disorders: Secondary | ICD-10-CM

## 2014-01-25 DIAGNOSIS — J4520 Mild intermittent asthma, uncomplicated: Secondary | ICD-10-CM

## 2014-01-25 DIAGNOSIS — F329 Major depressive disorder, single episode, unspecified: Secondary | ICD-10-CM

## 2014-01-25 DIAGNOSIS — G43009 Migraine without aura, not intractable, without status migrainosus: Secondary | ICD-10-CM | POA: Diagnosis not present

## 2014-01-25 DIAGNOSIS — F419 Anxiety disorder, unspecified: Principal | ICD-10-CM

## 2014-01-25 MED ORDER — PAROXETINE HCL 20 MG PO TABS: 20.0000 mg | ORAL_TABLET | Freq: Every day | ORAL | Status: DC

## 2014-01-25 MED ORDER — ALBUTEROL SULFATE 108 (90 BASE) MCG/ACT IN AEPB: 2.0000 | INHALATION_SPRAY | RESPIRATORY_TRACT | Status: DC | PRN

## 2014-01-25 MED ORDER — CLONAZEPAM 0.5 MG PO TABS: ORAL_TABLET | ORAL | Status: DC

## 2014-01-25 MED ORDER — FLUTICASONE-SALMETEROL 100-50 MCG/DOSE IN AEPB: INHALATION_SPRAY | RESPIRATORY_TRACT | Status: DC

## 2014-01-25 NOTE — Progress Notes (Signed)
   Subjective:    Patient ID: Paula Tucker, female    DOB: 11/20/1975, 39 y.o.   MRN: [id number]  HPI Patient is a 39 yo female who presents to the clinic for 6 month med follow-up.  Asthma-controlled on Advair 1 puff twice a day. She denies any concerns or complaints. She is not using her albuterol inhaler at all. In fact she does not even have a up-to-date refill if she needed this.  Anxiety and depression-she is currently taking Paxil daily with Klonopin one half tab of the 0.5 mg twice a day. She is doing great. She has no problems or concerns.  Migraine-she's had no migraines in the last 6 months. She has not had used Imitrex that she has on hand if needed.   Review of Systems  All other systems reviewed and are negative.      Objective:   Physical Exam  Constitutional: She is oriented to person, place, and time. She appears well-developed and well-nourished.  HENT:  Head: Normocephalic and atraumatic.  Cardiovascular: Normal rate, regular rhythm and normal heart sounds.   Pulmonary/Chest: Effort normal and breath sounds normal. She has no wheezes.  Neurological: She is alert and oriented to person, place, and time.  Psychiatric: She has a normal mood and affect. Her behavior is normal.          Assessment & Plan:  Asthma-refilled Advair for 7 months until her complete physical. ProAir respieclick was given with coupon for one for a refill to use as needed for any small exacerbations.  Anxiety/depression-GAD-7 was 2 and pH Q9 was 1. Patient appears to be very controlled. Paxil was refilled for 7 months as well as Klonopin. She is scheduled for complete physical in August.  Last LDL was checked in 2013 and LDL was 88. She is coming in this year for complete physical.

## 2014-03-23 ENCOUNTER — Other Ambulatory Visit: Payer: Self-pay | Admitting: Physician Assistant

## 2014-04-22 ENCOUNTER — Ambulatory Visit (INDEPENDENT_AMBULATORY_CARE_PROVIDER_SITE_OTHER): Payer: BC Managed Care – PPO | Admitting: Physician Assistant

## 2014-04-22 ENCOUNTER — Encounter: Payer: Self-pay | Admitting: Physician Assistant

## 2014-04-22 VITALS — BP 138/88 | HR 101 | Wt 217.0 lb

## 2014-04-22 DIAGNOSIS — R21 Rash and other nonspecific skin eruption: Secondary | ICD-10-CM

## 2014-04-22 MED ORDER — VALACYCLOVIR HCL 1 G PO TABS: 1000.0000 mg | ORAL_TABLET | Freq: Three times a day (TID) | ORAL | Status: DC

## 2014-04-22 NOTE — Progress Notes (Signed)
   Subjective:    Patient ID: Paula Tucker, female    DOB: 12/18/1975, 39 y.o.   MRN: [id number]  HPI  Pt presents to the clinic with a rash on her right buttocks and lateral legs for 5 days. She did have tingling and burning pain one day before rash devleoped. No itching. Starting to blister up. Nothing tried to make better. Very stressed at work. She did have chicken pox as a child.     Review of Systems  All other systems reviewed and are negative.      Objective:   Physical Exam  Constitutional: She is oriented to person, place, and time. She appears well-developed and well-nourished.  HENT:  Head: Normocephalic and atraumatic.  Pulmonary/Chest: Effort normal and breath sounds normal.  Neurological: She is alert and oriented to person, place, and time.  Skin: Skin is dry.     Psychiatric: She has a normal mood and affect. Her behavior is normal.          Assessment & Plan:  Right leg rash- viral culture done today. Suspect shingles. Treated as such. HO given. Cool compresses and ibuprofen for pain.

## 2014-04-22 NOTE — Patient Instructions (Signed)

## 2014-04-29 LAB — HERPES SIMPLEX VIRUS CULTURE: Organism ID, Bacteria: NOT DETECTED

## 2014-05-03 NOTE — Progress Notes (Signed)
The level 3 office visit that was charge is appropriate and supported by [facility]. Providers have to evaluate a problem before creating a management plan to address it. This is why office visit is charged. She presented with a new problem, Paula [facility] examined it, because its a rash there differential causes that must be ruled out and one [name] suspected was shingles.

## 2014-08-29 ENCOUNTER — Other Ambulatory Visit: Payer: Self-pay | Admitting: Physician Assistant

## 2014-08-29 DIAGNOSIS — Z0000 Encounter for general adult medical examination without abnormal findings: Secondary | ICD-10-CM

## 2014-08-29 DIAGNOSIS — I10 Essential (primary) hypertension: Secondary | ICD-10-CM

## 2014-08-29 LAB — CBC WITH DIFFERENTIAL/PLATELET
Basophils Absolute: 0.1 10*3/uL (ref 0.0–0.1)
Basophils Relative: 1 % (ref 0–1)
EOS ABS: 0.6 10*3/uL (ref 0.0–0.7)
Eosinophils Relative: 7 % — ABNORMAL HIGH (ref 0–5)
HCT: 39.5 % (ref 36.0–46.0)
HEMOGLOBIN: 12.9 g/dL (ref 12.0–15.0)
Lymphocytes Relative: 32 % (ref 12–46)
Lymphs Abs: 2.5 10*3/uL (ref 0.7–4.0)
MCH: 27.6 pg (ref 26.0–34.0)
MCHC: 32.7 g/dL (ref 30.0–36.0)
MCV: 84.4 fL (ref 78.0–100.0)
MONO ABS: 0.5 10*3/uL (ref 0.1–1.0)
MONOS PCT: 6 % (ref 3–12)
MPV: 10.2 fL (ref 8.6–12.4)
NEUTROS ABS: 4.3 10*3/uL (ref 1.7–7.7)
NEUTROS PCT: 54 % (ref 43–77)
PLATELETS: 353 10*3/uL (ref 150–400)
RBC: 4.68 MIL/uL (ref 3.87–5.11)
RDW: 14.1 % (ref 11.5–15.5)
WBC: 7.9 10*3/uL (ref 4.0–10.5)

## 2014-08-29 LAB — COMPLETE METABOLIC PANEL WITH GFR
ALT: 20 U/L (ref 6–29)
AST: 20 U/L (ref 10–30)
Albumin: 3.9 g/dL (ref 3.6–5.1)
Alkaline Phosphatase: 71 U/L (ref 33–115)
BUN: 9 mg/dL (ref 7–25)
CO2: 25 mmol/L (ref 20–31)
Calcium: 9 mg/dL (ref 8.6–10.2)
Chloride: 105 mmol/L (ref 98–110)
Creat: 0.53 mg/dL (ref 0.50–1.10)
GFR, Est African American: >89 mL/min (ref >=60)
Glucose, Bld: 88 mg/dL (ref 65–99)
POTASSIUM: 4.4 mmol/L (ref 3.5–5.3)
Sodium: 139 mmol/L (ref 135–146)
Total Bilirubin: 0.4 mg/dL (ref 0.2–1.2)
Total Protein: 7.2 g/dL (ref 6.1–8.1)

## 2014-08-29 LAB — LIPID PANEL
CHOL/HDL RATIO: 4.6 ratio (ref <=5.0)
Cholesterol: 188 mg/dL (ref 125–200)
HDL: 41 mg/dL — AB (ref >=46)
LDL CALC: 129 mg/dL (ref <130)
Triglycerides: 91 mg/dL (ref <150)
VLDL: 18 mg/dL (ref <30)

## 2014-09-02 ENCOUNTER — Ambulatory Visit (INDEPENDENT_AMBULATORY_CARE_PROVIDER_SITE_OTHER): Payer: BC Managed Care – PPO | Admitting: Physician Assistant

## 2014-09-02 ENCOUNTER — Encounter: Payer: Self-pay | Admitting: Physician Assistant

## 2014-09-02 VITALS — BP 138/82 | HR 88 | Ht 61.0 in | Wt 220.0 lb

## 2014-09-02 DIAGNOSIS — Z0000 Encounter for general adult medical examination without abnormal findings: Secondary | ICD-10-CM | POA: Diagnosis not present

## 2014-09-02 DIAGNOSIS — I10 Essential (primary) hypertension: Secondary | ICD-10-CM

## 2014-09-02 MED ORDER — FLUTICASONE-SALMETEROL 100-50 MCG/DOSE IN AEPB: INHALATION_SPRAY | RESPIRATORY_TRACT | Status: DC

## 2014-09-02 MED ORDER — CLONAZEPAM 0.5 MG PO TABS: ORAL_TABLET | ORAL | Status: DC

## 2014-09-02 MED ORDER — PAROXETINE HCL 20 MG PO TABS: 20.0000 mg | ORAL_TABLET | Freq: Every day | ORAL | Status: DC

## 2014-09-02 NOTE — Patient Instructions (Signed)

## 2014-09-03 NOTE — Progress Notes (Signed)
  Subjective:     Paula Tucker is a 39 y.o. female and is here for a comprehensive physical exam. The patient reports no problems.  Last pap was 2014 and normal.   She does want refills on medications.   Social History   Social History  . Marital Status: Married    Spouse Name: N/A  . Number of Children: N/A  . Years of Education: N/A   Occupational History  . Not on file.   Social History Main Topics  . Smoking status: Never Smoker   . Smokeless tobacco: Not on file  . Alcohol Use: No  . Drug Use: No  . Sexual Activity: Yes    Birth Control/ Protection: Pill   Other Topics Concern  . Not on file   Social History Narrative   Health Maintenance  Topic Date Due  . HIV Screening  11/14/1992  . INFLUENZA VACCINE  09/02/2015 (Originally 08/12/2014)  . TETANUS/TDAP  07/27/2021    The following portions of the patient's history were reviewed and updated as appropriate: allergies, current medications, past family history, past medical history, past social history, past surgical history and problem list.  Review of Systems A comprehensive review of systems was negative.   Objective:    BP 138/82 mmHg  Pulse 88  Ht  (1.549 m)  Wt 220 lb (99.791 kg)  BMI 41.59 kg/m2 General appearance: alert, cooperative, appears stated age and mildly obese Head: Normocephalic, without obvious abnormality, atraumatic Eyes: conjunctivae/corneas clear. PERRL, EOM's intact. Fundi benign. Ears: normal TM's and external ear canals both ears Nose: Nares normal. Septum midline. Mucosa normal. No drainage or sinus tenderness. Throat: lips, mucosa, and tongue normal; teeth and gums normal Neck: no adenopathy, no carotid bruit, no JVD, supple, symmetrical, trachea midline and thyroid not enlarged, symmetric, no tenderness/mass/nodules Back: symmetric, no curvature. ROM normal. No CVA tenderness. Lungs: clear to auscultation bilaterally Breasts: normal appearance, no masses or  tenderness Heart: regular rate and rhythm, S1, S2 normal, no murmur, click, rub or gallop Abdomen: soft, non-tender; bowel sounds normal; no masses,  no organomegaly Pelvic: external genitalia normal, no adnexal masses or tenderness, no cervical motion tenderness, uterus normal size, shape, and consistency and vagina normal without discharge Extremities: extremities normal, atraumatic, no cyanosis or edema Pulses: 2+ and symmetric Skin: Skin color, texture, turgor normal. No rashes or lesions Lymph nodes: Cervical, supraclavicular, and axillary nodes normal. Neurologic: Grossly normal    Assessment:    Healthy female exam.      Plan:    CPE- went over fasting labs. No concerns. Discussed vitamin d and calcium 800/1500. Encouraged exercise.discussed healthy diet and fruit and veggies.  Pap discussed for next year. Flu shot declined will get at work if offer or come back later.   HTN- BP elevated at first today and at second recheck under 140/90. Discussed diet and weight loss can help with HTN. Continue to monitor if above 140/90 need to consider medication.   Anxiety and depression- well controlled. Needs refill.   Migraine- controlled imitrex as needed.   See After Visit Summary for Counseling Recommendations

## 2014-12-16 ENCOUNTER — Other Ambulatory Visit: Payer: Self-pay | Admitting: Physician Assistant

## 2015-03-10 ENCOUNTER — Ambulatory Visit (INDEPENDENT_AMBULATORY_CARE_PROVIDER_SITE_OTHER): Payer: BC Managed Care – PPO | Admitting: Physician Assistant

## 2015-03-10 ENCOUNTER — Encounter: Payer: Self-pay | Admitting: Physician Assistant

## 2015-03-10 VITALS — BP 123/76 | HR 110 | Ht 61.0 in | Wt 208.0 lb

## 2015-03-10 DIAGNOSIS — F419 Anxiety disorder, unspecified: Principal | ICD-10-CM

## 2015-03-10 DIAGNOSIS — F329 Major depressive disorder, single episode, unspecified: Secondary | ICD-10-CM

## 2015-03-10 DIAGNOSIS — F418 Other specified anxiety disorders: Secondary | ICD-10-CM | POA: Diagnosis not present

## 2015-03-10 DIAGNOSIS — I10 Essential (primary) hypertension: Secondary | ICD-10-CM | POA: Diagnosis not present

## 2015-03-10 DIAGNOSIS — R229 Localized swelling, mass and lump, unspecified: Secondary | ICD-10-CM

## 2015-03-10 DIAGNOSIS — J45901 Unspecified asthma with (acute) exacerbation: Secondary | ICD-10-CM

## 2015-03-10 MED ORDER — AZITHROMYCIN 250 MG PO TABS: ORAL_TABLET | ORAL | Status: DC

## 2015-03-10 MED ORDER — PREDNISONE 50 MG PO TABS: ORAL_TABLET | ORAL | Status: DC

## 2015-03-10 MED ORDER — ALBUTEROL SULFATE 108 (90 BASE) MCG/ACT IN AEPB
2.0000 | INHALATION_SPRAY | RESPIRATORY_TRACT | Status: DC | PRN
Start: 2015-03-10 — End: 2018-11-03

## 2015-03-10 MED ORDER — CLONAZEPAM 0.5 MG PO TABS: ORAL_TABLET | ORAL | Status: DC

## 2015-03-10 MED ORDER — FLUTICASONE PROPIONATE 50 MCG/ACT NA SUSP: 2.0000 | Freq: Every day | NASAL | Status: DC

## 2015-03-10 NOTE — Progress Notes (Signed)
   Subjective:    Patient ID: Paula Tucker, female    DOB: 06-03-1975, 40 y.o.   MRN: [id number]  HPI  Pt presents to the clinic for her 6 month follow up.   Anxiety and depression doing well. Taking paxil daily. klonapin as needed. Not taking daily. She did not refill klonapin as many times as given but 6 month controlled substance policy is running out.   She does have some SOB, cough, wheezing, sinus pressure, ear itching for last 5 days. No fever, chills, body aches. She has tried tylenol cold and sinus, mucinex, decongestants. She feels a little better but still having some SOB and chest tightness. Using albuterol inhaler multiple times a day just for the last 5 days.   Asthma- controlled before the last 5 days of symptoms. Taking advair daily. Using rescue rarely on average.    She has noticed a skin bump for past couple of months. No pain, itching or bleeding associated. Never had anything like this before. No consistent sun exposure in this area. Not treated with anything.     Review of Systems  All other systems reviewed and are negative.      Objective:   Physical Exam  Constitutional: She is oriented to person, place, and time. She appears well-developed and well-nourished.  HENT:  Head: Normocephalic and atraumatic.  Right Ear: External ear normal.  Left Ear: External ear normal.  Nose: Nose normal.  Mouth/Throat: Oropharynx is clear and moist. No oropharyngeal exudate.  Eyes: Conjunctivae are normal. Right eye exhibits no discharge. Left eye exhibits no discharge.  Neck: Normal range of motion. Neck supple.  Cardiovascular: Regular rhythm and normal heart sounds.   Tachycardia at 110.   Pulmonary/Chest: Effort normal.  Scattered expiratory wheezing at bilateral lung base.   Lymphadenopathy:    She has no cervical adenopathy.  Neurological: She is alert and oriented to person, place, and time.  Skin:     Psychiatric: She has a normal mood and affect. Her  behavior is normal.          Assessment & Plan:  Anxiety and depression- GAD-7 was 6 and PHQ-9 was 5. Pt did not need refills at this time but good for next 6 months and will send refills if needed until then. Controlled on paxil. klonapin refilled today.   HTN- looks great without medications.   Asthmatic bronchitis with exacerbation- zpak and prednisone given. Use albuterol inhaler as needed every 2-4 hours for SOB and wheezing. Follow up if not improving. Discussed starting allergy prevention with zyrtec or claritin due to Paula warm weather.  advair refilled. flonase as needed.   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 Discussed with patient findings. Will continue to monitor any changes. The presence of telangectasia has properties of basal cell. This is on side of her right breast that has not had any consistent sun exposure.

## 2015-08-26 ENCOUNTER — Other Ambulatory Visit: Payer: Self-pay | Admitting: Physician Assistant

## 2015-08-29 ENCOUNTER — Other Ambulatory Visit: Payer: Self-pay | Admitting: Physician Assistant

## 2015-09-12 ENCOUNTER — Encounter: Payer: Self-pay | Admitting: Physician Assistant

## 2015-09-12 ENCOUNTER — Other Ambulatory Visit (HOSPITAL_COMMUNITY)
Admission: RE | Admit: 2015-09-12 | Discharge: 2015-09-12 | Disposition: A | Discharge status: Home / Self Care | Payer: BC Managed Care – PPO | Source: Ambulatory Visit | Attending: Physician Assistant | Admitting: Physician Assistant

## 2015-09-12 ENCOUNTER — Ambulatory Visit (INDEPENDENT_AMBULATORY_CARE_PROVIDER_SITE_OTHER): Payer: BC Managed Care – PPO | Admitting: Physician Assistant

## 2015-09-12 VITALS — BP 141/78 | HR 90 | Ht 61.0 in | Wt 217.0 lb

## 2015-09-12 DIAGNOSIS — Z01419 Encounter for gynecological examination (general) (routine) without abnormal findings: Secondary | ICD-10-CM | POA: Insufficient documentation

## 2015-09-12 DIAGNOSIS — Z131 Encounter for screening for diabetes mellitus: Secondary | ICD-10-CM | POA: Diagnosis not present

## 2015-09-12 DIAGNOSIS — Z0000 Encounter for general adult medical examination without abnormal findings: Secondary | ICD-10-CM | POA: Diagnosis not present

## 2015-09-12 DIAGNOSIS — Z13220 Encounter for screening for lipoid disorders: Secondary | ICD-10-CM | POA: Diagnosis not present

## 2015-09-12 DIAGNOSIS — Z1151 Encounter for screening for human papillomavirus (HPV): Secondary | ICD-10-CM | POA: Insufficient documentation

## 2015-09-12 MED ORDER — CLONAZEPAM 0.5 MG PO TABS: ORAL_TABLET | ORAL | 5 refills | Status: DC

## 2015-09-12 NOTE — Patient Instructions (Signed)
Dr. Spencer/dermatologist.  [facility].   Keeping You Healthy  Get These Tests 1. Blood Pressure- Have your blood pressure checked once a year by your health care provider.  Normal blood pressure is 120/80. 2. Weight- Have your body mass index (BMI) calculated to screen for obesity.  BMI is measure of body fat based on height and weight.  You can also calculate your own BMI at [email or web address].west-esparza.com/www.nhlbisupport.com/bmi/. 3. Cholesterol- Have your cholesterol checked every 5 years starting at age 38 then yearly starting at age 38. 4. Chlamydia, HIV, and other sexually transmitted diseases- Get screened every year until age 38, then within three months of each new sexual provider. 5. Pap Test - Every 1-5 years; discuss with your health care provider. 6. Mammogram- Every 1-2 years starting at age 38--50  Take these medicines  Calcium with Vitamin D-Your body needs 1200 mg of Calcium each day and [phone] IU of Vitamin D daily.  Your body can only absorb 500 mg of Calcium at a time so Calcium must be taken in 2 or 3 divided doses throughout the day.  Multivitamin with folic acid- Once daily if it is possible for you to become pregnant.  Get these Immunizations  Gardasil-Series of three doses; prevents HPV related illness such as genital warts and cervical cancer.  Menactra-Single dose; prevents meningitis.  Tetanus shot- Every 10 years.  Flu shot-Every year.  Take these steps 1. Do not smoke-Your healthcare provider can help you quit.  For tips on how to quit go to www.smokefree.gov or call 1-800 QUITNOW. 2. Be physically active- Exercise 5 days a week for at least 30 minutes.  If you are not already physically active, start slow and gradually work up to 30 minutes of moderate physical activity.  Examples of moderate activity include walking briskly, dancing, swimming, bicycling, etc. 3. Breast Cancer- A self breast exam every month is important for early detection of breast cancer.  For  more information and instruction on self breast exams, ask your healthcare provider or [place]es[place].womenshealth.gov/faq/breast-self-exam.cfm. 4. Eat a healthy diet- Eat a variety of healthy foods such as fruits, vegetables, whole grains, low fat milk, low fat cheeses, yogurt, lean meats, poultry and fish, beans, nuts, tofu, etc.  For more information go to www. Thenutritionsource.org 5. Drink alcohol in moderation- Limit alcohol intake to one drink or less per day. Never drink and drive. 6. Depression- Your emotional health is as important as your physical health.  If you're feeling down or losing interest in things you normally enjoy please talk to your healthcare provider about being screened for depression. 7. Dental visit- Brush and floss your teeth twice daily; visit your dentist twice a year. 8. Eye doctor- Get an eye exam at least every 2 years. 9. Helmet use- Always wear a helmet when riding a bicycle, motorcycle, rollerblading or skateboarding. 10. Safe sex- If you may be exposed to sexually transmitted infections, use a condom. 11. Seat belts- Seat belts can save your live; always wear one. 12. Smoke/Carbon Monoxide detectors- These detectors need to be installed on the appropriate level of your home. Replace batteries at least once a year. 13. Skin cancer- When out in the sun please cover up and use sunscreen 15 SPF or higher. 14. Violence- If anyone is threatening or hurting you, please tell your healthcare provider.

## 2015-09-14 NOTE — Progress Notes (Signed)
Subjective:     Paula Tucker is a 40 y.o. female and is here for a comprehensive physical exam. The patient reports no problems.  Social History   Social History  . Marital status: Married    Spouse name: N/A  . Number of children: N/A  . Years of education: N/A   Occupational History  . Not on file.   Social History Main Topics  . Smoking status: Never Smoker  . Smokeless tobacco: Not on file  . Alcohol use No  . Drug use: No  . Sexual activity: Yes    Birth control/ protection: Pill   Other Topics Concern  . Not on file   Social History Narrative  . No narrative on file   Health Maintenance  Topic Date Due  . PAP SMEAR  08/03/2015  . INFLUENZA VACCINE  09/11/2016 (Originally 08/12/2015)  . HIV Screening  09/11/2016 (Originally 11/14/1992)  . TETANUS/TDAP  07/27/2021    The following portions of the patient's history were reviewed and updated as appropriate: allergies, current medications, past family history, past medical history, past social history, past surgical history and problem list.  Review of Systems A comprehensive review of systems was negative.   Objective:    BP (!) 141/78   Pulse 90   Ht 5\' 1"  (1.549 m)   Wt 217 lb (98.4 kg)   BMI 41.00 kg/m  General appearance: alert, cooperative and appears stated age Head: Normocephalic, without obvious abnormality, atraumatic Eyes: conjunctivae/corneas clear. PERRL, EOM's intact. Fundi benign. Ears: normal TM's and external ear canals both ears Nose: Nares normal. Septum midline. Mucosa normal. No drainage or sinus tenderness. Throat: lips, mucosa, and tongue normal; teeth and gums normal Neck: no adenopathy, no carotid bruit, no JVD, supple, symmetrical, trachea midline and thyroid not enlarged, symmetric, no tenderness/mass/nodules Back: symmetric, no curvature. ROM normal. No CVA tenderness. Lungs: clear to auscultation bilaterally Breasts: normal appearance, no masses or tenderness Heart: regular rate  and rhythm, S1, S2 normal, no murmur, click, rub or gallop Abdomen: soft, non-tender; bowel sounds normal; no masses,  no organomegaly Pelvic: cervix normal in appearance, external genitalia normal, no adnexal masses or tenderness, no cervical motion tenderness, uterus normal size, shape, and consistency and vagina normal without discharge Extremities: extremities normal, atraumatic, no cyanosis or edema Pulses: 2+ and symmetric Skin: Skin color, texture, turgor normal. No rashes or lesions multiple nevus over entire body.  Lymph nodes: Cervical, supraclavicular, and axillary nodes normal. Neurologic: Alert and oriented X 3, normal strength and tone. Normal symmetric reflexes. Normal coordination and gait    Assessment:    Healthy female exam.     Plan:    CPE- pap done today. Declined flu shot. Lipid and cmp ordered. Discussed and encouraged 150minutes of exercise a week. Vitamin D 800 units and calcium 1500mg  daily. Encouraged weight loss.   Gave suggestion on dermatology referral with Dr. [facility].  See After Visit Summary for Counseling Recommendations

## 2015-09-17 LAB — CYTOLOGY - PAP

## 2015-09-29 ENCOUNTER — Ambulatory Visit (INDEPENDENT_AMBULATORY_CARE_PROVIDER_SITE_OTHER): Payer: BC Managed Care – PPO | Admitting: Physician Assistant

## 2015-09-29 ENCOUNTER — Encounter: Payer: Self-pay | Admitting: Physician Assistant

## 2015-09-29 VITALS — BP 125/75 | HR 81 | Temp 98.5°F | Ht 61.0 in | Wt 216.0 lb

## 2015-09-29 DIAGNOSIS — J0100 Acute maxillary sinusitis, unspecified: Secondary | ICD-10-CM

## 2015-09-29 MED ORDER — AZITHROMYCIN 250 MG PO TABS: ORAL_TABLET | ORAL | 0 refills | Status: DC

## 2015-09-29 NOTE — Patient Instructions (Signed)

## 2015-09-30 ENCOUNTER — Encounter: Payer: Self-pay | Admitting: Physician Assistant

## 2015-09-30 NOTE — Progress Notes (Signed)
   Subjective:    Patient ID: Paula LeanJenny Tucker, female    DOB: 08/29/1975, 40 y.o.   MRN: [id number]  HPI Pt is a 40 yo female who presents to the clinic with 2 weeks of sinus pressure, headache and nasal congestion. She has tried mucinex and decongestants. No fever, chills, body aches. She does have a dry cough.    Review of Systems    see HPI.  Objective:   Physical Exam  Constitutional: She is oriented to person, place, and time. She appears well-developed and well-nourished.  HENT:  Head: Normocephalic and atraumatic.  Right Ear: External ear normal.  Left Ear: External ear normal.  Mouth/Throat: Oropharynx is clear and moist. No oropharyngeal exudate.  TM's erythematous with some bulging. No pus or blood.  Tenderness over maxillary and frontal sinuses.  Bilateral nasal turbinates red and swollen.   Eyes: Conjunctivae are normal. Right eye exhibits no discharge.  Neck: Normal range of motion. Neck supple.  Cardiovascular: Normal rate, regular rhythm and normal heart sounds.   Pulmonary/Chest: Effort normal and breath sounds normal. She has no wheezes.  Lymphadenopathy:    She has no cervical adenopathy.  Neurological: She is alert and oriented to person, place, and time.  Skin: Skin is dry.  Psychiatric: She has a normal mood and affect. Her behavior is normal.          Assessment & Plan:  Acute maxillary sinusitis- treated with zpak and symptomatic care today. Continue with mucinex and flonase. Stay hydrated. Follow up as needed.

## 2016-01-27 ENCOUNTER — Ambulatory Visit (INDEPENDENT_AMBULATORY_CARE_PROVIDER_SITE_OTHER): Payer: BC Managed Care – PPO

## 2016-01-27 ENCOUNTER — Encounter: Payer: Self-pay | Admitting: Physician Assistant

## 2016-01-27 ENCOUNTER — Ambulatory Visit (INDEPENDENT_AMBULATORY_CARE_PROVIDER_SITE_OTHER): Payer: BC Managed Care – PPO | Admitting: Physician Assistant

## 2016-01-27 VITALS — BP 138/88 | HR 103 | Temp 98.3°F | Ht 61.0 in | Wt 219.0 lb

## 2016-01-27 DIAGNOSIS — R05 Cough: Secondary | ICD-10-CM

## 2016-01-27 DIAGNOSIS — H938X2 Other specified disorders of left ear: Secondary | ICD-10-CM | POA: Diagnosis not present

## 2016-01-27 DIAGNOSIS — R059 Cough, unspecified: Secondary | ICD-10-CM

## 2016-01-27 MED ORDER — PREDNISONE 20 MG PO TABS: ORAL_TABLET | ORAL | 0 refills | Status: DC

## 2016-01-27 NOTE — Patient Instructions (Addendum)
Consider claritin after completion of prednisone.  Continue flonase.  Start prednisone.

## 2016-01-27 NOTE — Progress Notes (Signed)
   Subjective:    Patient ID: Paula Tucker, female    DOB: 05/08/1975, 41 y.o.   MRN: [id number]  HPI  Pt is a 41 yo female who presents to the clinic with nasal congestion, off and on sinus pressure, ear pressure since mid December. Christmas eve she went to UC and given amoxicillin and prednisone for sinus infection. She has not gotten any better. She contnues to stay stopped up and has a lot of pressure in sinuses. Her left ear has a lot of pressure with occasional pain and some hearing loss. She does feel SOB at times like llittle things make her winded but no wheezing. She does have a productive cough with green to yellow sputum. No fever, chills, body aches.    Review of Systems    see HPI.  Objective:   Physical Exam  Constitutional: She is oriented to person, place, and time. She appears well-developed and well-nourished.  HENT:  Head: Normocephalic and atraumatic.  Right TM clear.  Left TM retraction seen with some bulging and what appears to be promient ossicle at top of TM and to the left. No blood or pus.  Bilateral nasal turbinates red and swollen.  Some mild tenderness over maxillary sinuses and nasal bridge.  Oropharynx with PND.  Eyes: Conjunctivae are normal. Right eye exhibits no discharge. Left eye exhibits no discharge.  Neck: Normal range of motion. Neck supple.  Cardiovascular: Normal rate, regular rhythm and normal heart sounds.   Pulmonary/Chest: Effort normal and breath sounds normal. She has no wheezes.  Lymphadenopathy:    She has no cervical adenopathy.  Neurological: She is alert and oriented to person, place, and time.  Psychiatric: She has a normal mood and affect. Her behavior is normal.    Weber lateralized to left ear.  Bilateral AC greater than BC but not twice as long.      Assessment & Plan:  .Marland Kitchen.[name] was seen today for left ear pain and cough.  Diagnoses and all orders for this visit:  Ear pressure, left -     predniSONE (DELTASONE) 20 MG  tablet; Take 3 tablets for 3 days, take 2 tablets for 3 days, take 1 tablet for 3 days, take 1/2 tablet for 4 days.  Cough -     DG Chest 2 View -     predniSONE (DELTASONE) 20 MG tablet; Take 3 tablets for 3 days, take 2 tablets for 3 days, take 1 tablet for 3 days, take 1/2 tablet for 4 days.   Reassured patient no signs of bacterial infection seen on exam.  Appears there could be some conductive hearing loss in left ear. Possible fluid in middle ear.  CXR negative for any acute process.  I do see a lot of ear retraction and bulging at top of TM. Start prednisone and recheck in 1 week.  Continue flonase and add claritin back in due to her mold allergy.

## 2016-02-03 ENCOUNTER — Ambulatory Visit (INDEPENDENT_AMBULATORY_CARE_PROVIDER_SITE_OTHER): Payer: BC Managed Care – PPO | Admitting: Physician Assistant

## 2016-02-03 VITALS — BP 140/88 | HR 87 | Temp 97.5°F

## 2016-02-03 DIAGNOSIS — H9312 Tinnitus, left ear: Secondary | ICD-10-CM | POA: Diagnosis not present

## 2016-02-03 DIAGNOSIS — Z01118 Encounter for examination of ears and hearing with other abnormal findings: Secondary | ICD-10-CM

## 2016-02-03 DIAGNOSIS — H938X2 Other specified disorders of left ear: Secondary | ICD-10-CM | POA: Diagnosis not present

## 2016-02-03 NOTE — Patient Instructions (Signed)
Will send to ENT

## 2016-02-04 ENCOUNTER — Encounter: Payer: Self-pay | Admitting: Physician Assistant

## 2016-02-04 DIAGNOSIS — H938X2 Other specified disorders of left ear: Secondary | ICD-10-CM | POA: Insufficient documentation

## 2016-02-04 DIAGNOSIS — Z01118 Encounter for examination of ears and hearing with other abnormal findings: Secondary | ICD-10-CM | POA: Insufficient documentation

## 2016-02-04 DIAGNOSIS — H9312 Tinnitus, left ear: Secondary | ICD-10-CM | POA: Insufficient documentation

## 2016-02-04 NOTE — Progress Notes (Signed)
   Subjective:    Patient ID: Paula Tucker, female    DOB: 10/06/1975, 41 y.o.   MRN: [id number]  HPI Pt is a 41 yo female who presents to the clinic for recheck on left ear. She continues to have ringing and left ear pressure but has much improved since starting prednisone.  She continues to use atrovent and flonase and claritin.    Review of Systems  All other systems reviewed and are negative.      Objective:   Physical Exam  Constitutional: She is oriented to person, place, and time. She appears well-developed and well-nourished.  HENT:  Head: Normocephalic and atraumatic.  Ears:  Cardiovascular: Normal rate, regular rhythm and normal heart sounds.   Pulmonary/Chest: Effort normal and breath sounds normal. She has no wheezes.  Neurological: She is alert and oriented to person, place, and time.  Psychiatric: She has a normal mood and affect. Her behavior is normal.          Assessment & Plan:  Marland Kitchen.Marland Kitchen.Diagnoses and all orders for this visit:  Abnormal otoscopic exam of left ear -     Ambulatory referral to ENT  Tinnitus of left ear -     Ambulatory referral to ENT  Ear pressure, left -     Ambulatory referral to ENT  unclear etiology. Landmarks in left ear are much different than left. Pt is still symptomatic. Continue on atrovent and flonase nasal spray. I would like for ENT to at least take a peak and make sure there are no massed behind left TM. Complete prednisone course.

## 2016-03-21 ENCOUNTER — Other Ambulatory Visit: Payer: Self-pay | Admitting: Physician Assistant

## 2016-04-09 ENCOUNTER — Ambulatory Visit: Payer: BC Managed Care – PPO | Admitting: Physician Assistant

## 2016-04-14 ENCOUNTER — Encounter: Payer: Self-pay | Admitting: Physician Assistant

## 2016-04-14 ENCOUNTER — Ambulatory Visit (INDEPENDENT_AMBULATORY_CARE_PROVIDER_SITE_OTHER): Payer: BC Managed Care – PPO | Admitting: Physician Assistant

## 2016-04-14 VITALS — BP 135/77 | HR 93 | Ht 61.0 in | Wt 218.0 lb

## 2016-04-14 DIAGNOSIS — J339 Nasal polyp, unspecified: Secondary | ICD-10-CM | POA: Diagnosis not present

## 2016-04-14 DIAGNOSIS — J4520 Mild intermittent asthma, uncomplicated: Secondary | ICD-10-CM | POA: Diagnosis not present

## 2016-04-14 DIAGNOSIS — F418 Other specified anxiety disorders: Secondary | ICD-10-CM | POA: Diagnosis not present

## 2016-04-14 DIAGNOSIS — F329 Major depressive disorder, single episode, unspecified: Secondary | ICD-10-CM

## 2016-04-14 DIAGNOSIS — J329 Chronic sinusitis, unspecified: Secondary | ICD-10-CM | POA: Insufficient documentation

## 2016-04-14 DIAGNOSIS — F32A Depression, unspecified: Secondary | ICD-10-CM

## 2016-04-14 DIAGNOSIS — F419 Anxiety disorder, unspecified: Principal | ICD-10-CM

## 2016-04-14 MED ORDER — FLUTICASONE-SALMETEROL 100-50 MCG/DOSE IN AEPB: INHALATION_SPRAY | RESPIRATORY_TRACT | 11 refills | Status: DC

## 2016-04-14 MED ORDER — PAROXETINE HCL 20 MG PO TABS: 20.0000 mg | ORAL_TABLET | Freq: Every day | ORAL | 3 refills | Status: DC

## 2016-04-14 NOTE — Progress Notes (Signed)
   Subjective:    Patient ID: Paula Tucker, female    DOB: Feb 17, 1975, 41 y.o.   MRN: [id number]  HPI  Pt is a 41 yo female who presents to the clinic for 6 month follow up.   Anxiety and depression is doing really well. She is on paxil and klonapin 1-2 tablets daily. She is in a stressful time at school but feels controlled.   Asthma- controlled with advair. Not using inhaler very often. She is having surgery to remove polyps in June. Per CT her sinuses are filled with inflammation. She was allergy tested and only came back positive for mold on corn.     Review of Systems  All other systems reviewed and are negative.      Objective:   Physical Exam  Constitutional: She is oriented to person, place, and time. She appears well-developed and well-nourished.  HENT:  Head: Normocephalic and atraumatic.  Right Ear: External ear normal.  Left Ear: External ear normal.  Eyes: Conjunctivae are normal. Right eye exhibits no discharge. Left eye exhibits no discharge.  Neck: Normal range of motion. Neck supple. No thyromegaly present.  Cardiovascular: Normal rate, regular rhythm and normal heart sounds.   Pulmonary/Chest: Effort normal and breath sounds normal. She has no wheezes.  Lymphadenopathy:    She has no cervical adenopathy.  Neurological: She is alert and oriented to person, place, and time.  Psychiatric: She has a normal mood and affect. Her behavior is normal.          Assessment & Plan:  .Marland KitchenNekisha was seen today for depression and anxiety.  Diagnoses and all orders for this visit:  Anxiety and depression -     PARoxetine (PAXIL) 20 MG tablet; Take 1 tablet (20 mg total) by mouth daily.  Mild intermittent asthma without complication -     Fluticasone-Salmeterol (ADVAIR DISKUS) 100-50 MCG/DOSE AEPB; INHALE 1 PUFF INTO THE LUNGS 2 (TWO) TIMES DAILY.  Sinusitis with nasal polyps   PENTA manages sinusitis/nasal polyps.   .. Depression screen [facility] 2/9 04/14/2016 03/10/2015   Decreased Interest 1 1  Down, Depressed, Hopeless 1 1  PHQ - 2 Score 2 2  Altered sleeping - 0  Tired, decreased energy - 2  Change in appetite - 0  Feeling bad or failure about yourself  - 1  Trouble concentrating - 0  Moving slowly or fidgety/restless - 0  Suicidal thoughts - 0  PHQ-9 Score - 5   Refilled paxil/klonapin for 6 months.   Continue advair for asthma.

## 2016-04-19 ENCOUNTER — Other Ambulatory Visit: Payer: Self-pay | Admitting: Physician Assistant

## 2016-04-19 DIAGNOSIS — J4520 Mild intermittent asthma, uncomplicated: Secondary | ICD-10-CM

## 2016-05-24 ENCOUNTER — Other Ambulatory Visit: Payer: Self-pay | Admitting: Physician Assistant

## 2016-06-21 ENCOUNTER — Encounter: Payer: Self-pay | Admitting: Family Medicine

## 2016-06-21 ENCOUNTER — Ambulatory Visit (INDEPENDENT_AMBULATORY_CARE_PROVIDER_SITE_OTHER): Payer: BC Managed Care – PPO | Admitting: Family Medicine

## 2016-06-21 VITALS — BP 120/76 | HR 115 | Temp 99.4°F | Ht 60.93 in | Wt 211.0 lb

## 2016-06-21 DIAGNOSIS — J40 Bronchitis, not specified as acute or chronic: Secondary | ICD-10-CM

## 2016-06-21 DIAGNOSIS — J329 Chronic sinusitis, unspecified: Secondary | ICD-10-CM

## 2016-06-21 MED ORDER — AMOXICILLIN-POT CLAVULANATE 875-125 MG PO TABS: 1.0000 | ORAL_TABLET | Freq: Two times a day (BID) | ORAL | 0 refills | Status: DC

## 2016-06-21 NOTE — Progress Notes (Signed)
   Subjective:    Patient ID: Paula Tucker, female    DOB: 11/02/1975, 41 y.o.   MRN: [id number]  HPI  41 year old female with a history of asthma comes in today complaining of cough and fever over the last 3 days. He is actually had chronic sinusitis and congestion and nasal polyps that started back in December. In fact she is actually scheduled to have surgery next month. She started feeling worse a few days ago with increased cough, headaches and nasal congestion. Highest fever was 101.7 around 12:00 today. She took some Tylenol around 1:00. She's also been taking some Advil Cold and Sinus. She's just not feeling well today. Last time she had antibiotics was in December and ended up on high doses of steroids for a long  taper.  Review of Systems     Objective:   Physical Exam  Constitutional: She is oriented to person, place, and time. She appears well-developed and well-nourished.  HENT:  Head: Normocephalic and atraumatic.  Right Ear: External ear normal.  Left Ear: External ear normal.  Nose: Nose normal.  Mouth/Throat: Oropharynx is clear and moist.  TMs and canals are clear.   Eyes: Conjunctivae and EOM are normal. Pupils are equal, round, and reactive to light.  Neck: Neck supple. No thyromegaly present.  Cardiovascular: Normal rate, regular rhythm and normal heart sounds.   Pulmonary/Chest: Effort normal and breath sounds normal. She has no wheezes.  Lymphadenopathy:    She has no cervical adenopathy.  Neurological: She is alert and oriented to person, place, and time.  Skin: Skin is warm and dry.  Psychiatric: She has a normal mood and affect.       Assessment & Plan:  Sinobronchits - Will treat with Augmentin for 10 days. If not somewhat better by the end we consider adding prednisone if needed.  Keep f/u with ENT next months.   OK for tylenol for fever relief.  Offered to add prednisone if she's not improvin, especially with her history of nasal polyps.

## 2016-06-21 NOTE — Patient Instructions (Signed)
We can add prednisone if not feeling better by the end of the week.  Make sure using nasal saline twice a day.

## 2016-09-17 ENCOUNTER — Encounter: Payer: Self-pay | Admitting: Physician Assistant

## 2016-09-17 ENCOUNTER — Ambulatory Visit (INDEPENDENT_AMBULATORY_CARE_PROVIDER_SITE_OTHER): Payer: BC Managed Care – PPO | Admitting: Physician Assistant

## 2016-09-17 VITALS — BP 130/80 | HR 75 | Ht 61.0 in | Wt 215.0 lb

## 2016-09-17 DIAGNOSIS — Z0000 Encounter for general adult medical examination without abnormal findings: Secondary | ICD-10-CM | POA: Diagnosis not present

## 2016-09-17 DIAGNOSIS — Z131 Encounter for screening for diabetes mellitus: Secondary | ICD-10-CM

## 2016-09-17 DIAGNOSIS — Z13220 Encounter for screening for lipoid disorders: Secondary | ICD-10-CM | POA: Diagnosis not present

## 2016-09-17 NOTE — Patient Instructions (Addendum)
Keeping You Healthy  Get These Tests 1. Blood Pressure- Have your blood pressure checked once a year by your health care provider.  Normal blood pressure is 120/80. 2. Weight- Have your body mass index (BMI) calculated to screen for obesity.  BMI is measure of body fat based on height and weight.  You can also calculate your own BMI at [email or web address].west-esparza.com/www.nhlbisupport.com/bmi/. 3. Cholesterol- Have your cholesterol checked every 5 years starting at age 39 then yearly starting at age 39. 4. Chlamydia, HIV, and other sexually transmitted diseases- Get screened every year until age 125, then within three months of each new sexual provider. 5. Pap Test - Every 1-5 years; discuss with your health care provider. 6. Mammogram- Every 1-2 years starting at age 39--50  Take these medicines  Calcium with Vitamin D-Your body needs 1200 mg of Calcium each day and [phone] IU of Vitamin D daily.  Your body can only absorb 500 mg of Calcium at a time so Calcium must be taken in 2 or 3 divided doses throughout the day.  Multivitamin with folic acid- Once daily if it is possible for you to become pregnant.  Get these Immunizations  Gardasil-Series of three doses; prevents HPV related illness such as genital warts and cervical cancer.  Menactra-Single dose; prevents meningitis.  Tetanus shot- Every 10 years.  Flu shot-Every year.  Take these steps 1. Do not smoke-Your healthcare provider can help you quit.  For tips on how to quit go to www.smokefree.gov or call 1-800 QUITNOW. 2. Be physically active- Exercise 5 days a week for at least 30 minutes.  If you are not already physically active, start slow and gradually work up to 30 minutes of moderate physical activity.  Examples of moderate activity include walking briskly, dancing, swimming, bicycling, etc. 3. Breast Cancer- A self breast exam every month is important for early detection of breast cancer.  For more information and instruction on self breast exams, ask your  healthcare provider or [place].w[place]health.gov/faq/breast-self-exam.cfm. 4. Eat a healthy diet- Eat a variety of healthy foods such as fruits, vegetables, whole grains, low fat milk, low fat cheeses, yogurt, lean meats, poultry and fish, beans, nuts, tofu, etc.  For more information go to www. Thenutritionsource.org 5. Drink alcohol in moderation- Limit alcohol intake to one drink or less per day. Never drink and drive. 6. Depression- Your emotional health is as important as your physical health.  If you're feeling down or losing interest in things you normally enjoy please talk to your healthcare provider about being screened for depression. 7. Dental visit- Brush and floss your teeth twice daily; visit your dentist twice a year. 8. Eye doctor- Get an eye exam at least every 2 years. 9. Helmet use- Always wear a helmet when riding a bicycle, motorcycle, rollerblading or skateboarding. 10. Safe sex- If you may be exposed to sexually transmitted infections, use a condom. 11. Seat belts- Seat belts can save your live; always wear one. 12. Smoke/Carbon Monoxide detectors- These detectors need to be installed on the appropriate level of your home. Replace batteries at least once a year. 13. Skin cancer- When out in the sun please cover up and use sunscreen 15 SPF or higher. 14. Violence- If anyone is threatening or hurting you, please tell your healthcare provider.     saxenda belviq contrave qsymia

## 2016-09-19 DIAGNOSIS — E66813 Obesity, class 3: Secondary | ICD-10-CM | POA: Insufficient documentation

## 2016-09-19 NOTE — Progress Notes (Signed)
Subjective:     Paula Tucker is a 41 y.o. female and is here for a comprehensive physical exam. The patient reports no problems.  Social History   Social History  . Marital status: Married    Spouse name: N/A  . Number of children: N/A  . Years of education: N/A   Occupational History  . Not on file.   Social History Main Topics  . Smoking status: Never Smoker  . Smokeless tobacco: Never Used  . Alcohol use No  . Drug use: No  . Sexual activity: Yes    Birth control/ protection: Pill   Other Topics Concern  . Not on file   Social History Narrative  . No narrative on file   Health Maintenance  Topic Date Due  . HIV Screening  11/14/1992  . INFLUENZA VACCINE  10/07/2017 (Originally 08/11/2016)  . PAP SMEAR  09/12/2018  . TETANUS/TDAP  07/27/2021    The following portions of the patient's history were reviewed and updated as appropriate: allergies, current medications, past family history, past medical history, past social history, past surgical history and problem list.  Review of Systems A comprehensive review of systems was negative.   Objective:    BP 130/80   Pulse 75   Ht 5\' 1"  (1.549 m)   Wt 215 lb (97.5 kg)   BMI 40.62 kg/m  General appearance: alert, cooperative and appears stated age Head: Normocephalic, without obvious abnormality, atraumatic Eyes: conjunctivae/corneas clear. PERRL, EOM's intact. Fundi benign. Ears: normal TM's and external ear canals both ears Nose: Nares normal. Septum midline. Mucosa normal. No drainage or sinus tenderness. Throat: lips, mucosa, and tongue normal; teeth and gums normal Neck: no adenopathy, no carotid bruit, no JVD, supple, symmetrical, trachea midline and thyroid not enlarged, symmetric, no tenderness/mass/nodules Back: symmetric, no curvature. ROM normal. No CVA tenderness. Lungs: clear to auscultation bilaterally Breasts: normal appearance, no masses or tenderness Heart: regular rate and rhythm, S1, S2 normal, no  murmur, click, rub or gallop Abdomen: soft, non-tender; bowel sounds normal; no masses,  no organomegaly Pelvic: external genitalia normal, no adnexal masses or tenderness, no cervical motion tenderness and uterus normal size, shape, and consistency Extremities: extremities normal, atraumatic, no cyanosis or edema Pulses: 2+ and symmetric Skin: Skin color, texture, turgor normal. No rashes or lesions Lymph nodes: Cervical, supraclavicular, and axillary nodes normal. Neurologic: Alert and oriented X 3, normal strength and tone. Normal symmetric reflexes. Normal coordination and gait    Assessment:    Healthy female exam.       Plan:    .Marland Kitchen.Paula Tucker was seen today for annual exam.  Diagnoses and all orders for this visit:  Routine physical examination -     Lipid Panel w/reflex Direct LDL -     COMPLETE METABOLIC PANEL WITH GFR  Screening for diabetes mellitus -     COMPLETE METABOLIC PANEL WITH GFR  Screening for lipid disorders -     Lipid Panel w/reflex Direct LDL  Morbid obesity (HCC)   .Marland Kitchen. [facility]19/2016 04/14/2016 03/10/2015  Decreased Interest 0 1 1  Down, Depressed, Hopeless 0 1 1  PHQ - 2 Score 0 2 2  Altered sleeping - - 0  Tired, decreased energy - - 2  Change in appetite - - 0  Feeling bad or failure about yourself  - - 1  Trouble concentrating - - 0  Moving slowly or fidgety/restless - - 0  Suicidal thoughts - - 0  PHQ-9 Score - -  5   PaP up to date.  Not due for screening mammogram.   Discussed 150 minutes of exercise a week.  Encouraged vitamin D 1000 units and Calcium  or 4 servings of dairy a day.  Marland Kitchen.Discussed low carb diet with 1500 calories and 80g of protein.  My Fitness Pal could be a Chief Technology Officer.  Discussed medications that could help. Due to anxiety in past phentermine might not be the best fit. Will follow up in future if not able to start losing weight.    See After Visit Summary for Counseling Recommendations

## 2016-10-13 ENCOUNTER — Other Ambulatory Visit: Payer: Self-pay | Admitting: Physician Assistant

## 2016-10-14 ENCOUNTER — Other Ambulatory Visit: Payer: Self-pay | Admitting: Physician Assistant

## 2017-03-18 ENCOUNTER — Ambulatory Visit: Payer: BC Managed Care – PPO | Admitting: Physician Assistant

## 2017-04-01 ENCOUNTER — Encounter: Payer: Self-pay | Admitting: Physician Assistant

## 2017-04-01 ENCOUNTER — Ambulatory Visit (INDEPENDENT_AMBULATORY_CARE_PROVIDER_SITE_OTHER): Payer: BC Managed Care – PPO | Admitting: Physician Assistant

## 2017-04-01 VITALS — BP 132/84 | HR 79 | Ht 61.0 in | Wt 222.0 lb

## 2017-04-01 DIAGNOSIS — I10 Essential (primary) hypertension: Secondary | ICD-10-CM

## 2017-04-01 DIAGNOSIS — Z13220 Encounter for screening for lipoid disorders: Secondary | ICD-10-CM

## 2017-04-01 DIAGNOSIS — Z131 Encounter for screening for diabetes mellitus: Secondary | ICD-10-CM

## 2017-04-01 DIAGNOSIS — F32A Depression, unspecified: Secondary | ICD-10-CM

## 2017-04-01 DIAGNOSIS — J4520 Mild intermittent asthma, uncomplicated: Secondary | ICD-10-CM

## 2017-04-01 DIAGNOSIS — F419 Anxiety disorder, unspecified: Secondary | ICD-10-CM | POA: Diagnosis not present

## 2017-04-01 DIAGNOSIS — F329 Major depressive disorder, single episode, unspecified: Secondary | ICD-10-CM

## 2017-04-01 MED ORDER — CLONAZEPAM 0.5 MG PO TABS: 0.5000 mg | ORAL_TABLET | Freq: Two times a day (BID) | ORAL | 5 refills | Status: DC

## 2017-04-01 MED ORDER — FLUTICASONE-SALMETEROL 100-50 MCG/DOSE IN AEPB: INHALATION_SPRAY | RESPIRATORY_TRACT | 11 refills | Status: DC

## 2017-04-01 MED ORDER — PAROXETINE HCL 20 MG PO TABS: 20.0000 mg | ORAL_TABLET | Freq: Every day | ORAL | 3 refills | Status: DC

## 2017-04-03 NOTE — Progress Notes (Signed)
   Subjective:    Patient ID: Paula Tucker, female    DOB: 09/01/1975, 42 y.o.   MRN: [id number]  HPI Pt is a 42 yo obese female with asthma, HTN, anxiety and depression who presents to the clinic for 6 month follow up.   Overall patient is doing great. Denies any CP, palpitations, SOB, headaches. No SI/HC. She is happy.   .. Active Ambulatory Problems    Diagnosis Date Noted  . Anxiety and depression 06/14/2011  . Asthma 06/14/2011  . Hypertension 07/29/2011  . Migraine 03/04/2012  . Skin nodule 03/10/2015  . 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 Ear pressure, left 02/04/2016  . Abnormal otoscopic exam of left ear 02/04/2016  . Sinusitis with nasal polyps 04/14/2016  . Morbid obesity (HCC) 09/19/2016   Resolved Ambulatory Problems    Diagnosis Date Noted  . No Resolved Ambulatory Problems   Past Medical History:  Diagnosis Date  . Anxiety   . Asthma       Review of Systems See HPI.     Objective:   Physical Exam  Constitutional: She is oriented to person, place, and time. She appears well-developed and well-nourished.  Obese.   HENT:  Head: Normocephalic and atraumatic.  Cardiovascular: Normal rate, regular rhythm and normal heart sounds.  Pulmonary/Chest: Effort normal and breath sounds normal. She has no wheezes.  Neurological: She is alert and oriented to person, place, and time.  Psychiatric: She has a normal mood and affect. Her behavior is normal.          Assessment & Plan:  .Marland Kitchen.Paula Tucker was seen today for anxiety and depression.  Diagnoses and all orders for this visit:  Anxiety and depression -     PARoxetine (PAXIL) 20 MG tablet; Take 1 tablet (20 mg total) by mouth daily.  Hypertension, unspecified type  Mild intermittent asthma without complication -     Fluticasone-Salmeterol (ADVAIR DISKUS) 100-50 MCG/DOSE AEPB; INHALE 1 PUFF INTO THE LUNGS 2 (TWO) TIMES DAILY.  Screening for lipid disorders -     Lipid Panel w/reflex Direct LDL  Screening  for diabetes mellitus -     COMPLETE METABOLIC PANEL WITH GFR  Morbid obesity (HCC) -     Lipid Panel w/reflex Direct LDL -     COMPLETE METABOLIC PANEL WITH GFR -     TSH  Other orders -     clonazePAM (KLONOPIN) 0.5 MG tablet; Take 1 tablet (0.5 mg total) by mouth 2 (two) times daily.  .. [facility] 09/19/2016 04/14/2016 03/10/2015  Decreased Interest 0 1 1  Down, Depressed, Hopeless 0 1 1  PHQ - 2 Score 0 2 2  Altered sleeping - - 0  Tired, decreased energy - - 2  Change in appetite - - 0  Feeling bad or failure about yourself  - - 1  Trouble concentrating - - 0  Moving slowly or fidgety/restless - - 0  Suicidal thoughts - - 0  PHQ-9 Score - - 5   .Marland Kitchen. GAD 7 : Generalized Anxiety Score 03/10/2015  Nervous, Anxious, on Edge 1  Control/stop worrying 0  Worry too much - different things 2  Trouble relaxing 2  Restless 0  Easily annoyed or irritable 1  Afraid - awful might happen 0  Total GAD 7 Score 6  Anxiety Difficulty Not difficult at all   Fasting labs ordered.   Medication refills given.

## 2017-05-11 LAB — COMPLETE METABOLIC PANEL WITH GFR
AG RATIO: 1.4 (calc) (ref 1.0–2.5)
ALBUMIN MSPROF: 4 g/dL (ref 3.6–5.1)
ALKALINE PHOSPHATASE (APISO): 62 U/L (ref 33–115)
ALT: 18 U/L (ref 6–29)
AST: 19 U/L (ref 10–30)
BUN: 12 mg/dL (ref 7–25)
CO2: 27 mmol/L (ref 20–32)
CREATININE: 0.68 mg/dL (ref 0.50–1.10)
Calcium: 9.2 mg/dL (ref 8.6–10.2)
Chloride: 105 mmol/L (ref 98–110)
GFR, EST NON AFRICAN AMERICAN: 110 mL/min/{1.73_m2} (ref >=60)
GFR, Est African American: 128 mL/min/{1.73_m2} (ref >=60)
GLOBULIN: 2.8 g/dL (ref 1.9–3.7)
Glucose, Bld: 92 mg/dL (ref 65–99)
POTASSIUM: 4.4 mmol/L (ref 3.5–5.3)
SODIUM: 138 mmol/L (ref 135–146)
Total Bilirubin: 0.3 mg/dL (ref 0.2–1.2)
Total Protein: 6.8 g/dL (ref 6.1–8.1)

## 2017-05-11 LAB — LIPID PANEL W/REFLEX DIRECT LDL
Cholesterol: 170 mg/dL (ref <200)
HDL: 51 mg/dL (ref >50)
LDL CHOLESTEROL (CALC): 103 mg/dL — AB
Non-HDL Cholesterol (Calc): 119 mg/dL (calc) (ref <130)
Total CHOL/HDL Ratio: 3.3 (calc) (ref <5.0)
Triglycerides: 72 mg/dL (ref <150)

## 2017-05-11 LAB — TSH: TSH: 2.36 m[IU]/L

## 2017-05-12 NOTE — Progress Notes (Signed)
Call pt: cholesterol looks great. HDL improved. Way to go. Kidney, liver, glucose look great. Thyroid is normal.

## 2017-07-08 ENCOUNTER — Encounter: Payer: Self-pay | Admitting: Physician Assistant

## 2017-07-08 ENCOUNTER — Ambulatory Visit (INDEPENDENT_AMBULATORY_CARE_PROVIDER_SITE_OTHER): Payer: BC Managed Care – PPO | Admitting: Physician Assistant

## 2017-07-08 VITALS — BP 143/72 | HR 87 | Temp 98.7°F | Wt 225.0 lb

## 2017-07-08 DIAGNOSIS — B372 Candidiasis of skin and nail: Secondary | ICD-10-CM

## 2017-07-08 MED ORDER — FLUCONAZOLE 150 MG PO TABS: 150.0000 mg | ORAL_TABLET | Freq: Once | ORAL | 0 refills | Status: AC

## 2017-07-08 MED ORDER — CLOTRIMAZOLE-BETAMETHASONE 1-0.05 % EX CREA: 1.0000 "application " | TOPICAL_CREAM | Freq: Two times a day (BID) | CUTANEOUS | 0 refills | Status: DC

## 2017-07-08 NOTE — Progress Notes (Signed)
   Subjective:    Patient ID: Paula Tucker, female    DOB: 10/14/1975, 42 y.o.   MRN: [id number]  HPI Patient is a 42 year old female who presents to the clinic with a red itchy rash of bilateral groin and vulva ongoing for at least a week.  She thought this was ringworm and used some over-the-counter Lotrimin cream she had no real benefit.  She denies any pain or vaginal discharge.  She has never had anything like this before.  She has been doing a lot of of travel to different conferences.  .. Active Ambulatory Problems    Diagnosis Date Noted  . Anxiety and depression 06/14/2011  . Asthma 06/14/2011  . Hypertension 07/29/2011  . Migraine 03/04/2012  . Skin nodule 03/10/2015  . 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 Ear pressure, left 02/04/2016  . Abnormal otoscopic exam of left ear 02/04/2016  . Sinusitis with nasal polyps 04/14/2016  . Morbid obesity (HCC) 09/19/2016   Resolved Ambulatory Problems    Diagnosis Date Noted  . No Resolved Ambulatory Problems   Past Medical History:  Diagnosis Date  . Anxiety   . Asthma       Review of Systems  All other systems reviewed and are negative.      Objective:   Physical Exam  Constitutional: She appears well-developed and well-nourished.  HENT:  Head: Normocephalic and atraumatic.  Cardiovascular: Normal rate and regular rhythm.  Skin:     Psychiatric: She has a normal mood and affect. Her behavior is normal.          Assessment & Plan:  .Marland Kitchen.Paula Tucker was seen today for rash.  Diagnoses and all orders for this visit:  Candidal intertrigo -     fluconazole (DIFLUCAN) 150 MG tablet; Take 1 tablet (150 mg total) by mouth once for 1 dose. Repeat in 48- 72 hours if symptoms persist. -     clotrimazole-betamethasone (LOTRISONE) cream; Apply 1 application topically 2 (two) times daily.   Rashes consistent with yeast.  Handout given on prevention.  Diflucan tablet to start today repeat in 48 to 72 hours if symptoms persist.   Lotrisone cream to calm down inflammation.  Follow-up if no improvement in 3 days.

## 2017-07-08 NOTE — Patient Instructions (Signed)
Intertrigo Intertrigo is skin irritation (inflammation) that happens in warm, moist areas of the body. The irritation can cause a rash and make skin raw and itchy. The rash is usually pink or red. It happens mostly between folds of skin or where skin rubs together, such as:  Toes.  Armpits.  Groin.  Belly.  Breasts.  Buttocks.  This condition is not passed from person to person (is not contagious). Follow these instructions at home:  Keep the affected area clean and dry.  Do not scratch your skin.  Stay cool as much as possible. Use an air conditioner or fan, if you can.  Apply over-the-counter and prescription medicines only as told by your doctor.  If you were prescribed an antibiotic medicine, use it as told by your doctor. Do not stop using the antibiotic even if your condition starts to get better.  Keep all follow-up visits as told by your doctor. This is important. How is this prevented?  Stay at a healthy weight.  Keep your feet dry. This is very important if you have diabetes. Wear cotton or wool socks.  Take care of and protect the skin in your groin and butt area as told by your doctor.  Do not wear tight clothes. Wear clothes that: ? Are loose. ? Take away moisture from your body. ? Are made of cotton.  Wear a bra that gives good support, if needed.  Shower and dry yourself fully after being active.  Keep your blood sugar under control if you have diabetes. Contact a doctor if:  Your symptoms do not get better with treatment.  Your symptoms get worse or they spread.  You notice more redness and warmth.  You have a fever. This information is not intended to replace advice given to you by your health care provider. Make sure you discuss any questions you have with your health care provider. Document Released: 01/30/2010 Document Revised: 06/05/2015 Document Reviewed: 07/01/2014 Elsevier Interactive Patient Education  2018 Elsevier Inc.  

## 2017-09-16 ENCOUNTER — Encounter: Payer: BC Managed Care – PPO | Admitting: Physician Assistant

## 2017-10-04 ENCOUNTER — Encounter: Payer: Self-pay | Admitting: Physician Assistant

## 2017-10-04 ENCOUNTER — Ambulatory Visit (INDEPENDENT_AMBULATORY_CARE_PROVIDER_SITE_OTHER): Payer: BC Managed Care – PPO | Admitting: Physician Assistant

## 2017-10-04 VITALS — BP 133/80 | HR 84 | Ht 61.0 in | Wt 223.0 lb

## 2017-10-04 DIAGNOSIS — F419 Anxiety disorder, unspecified: Secondary | ICD-10-CM | POA: Diagnosis not present

## 2017-10-04 DIAGNOSIS — Z1231 Encounter for screening mammogram for malignant neoplasm of breast: Secondary | ICD-10-CM

## 2017-10-04 DIAGNOSIS — R0989 Other specified symptoms and signs involving the circulatory and respiratory systems: Secondary | ICD-10-CM

## 2017-10-04 DIAGNOSIS — Z0000 Encounter for general adult medical examination without abnormal findings: Secondary | ICD-10-CM

## 2017-10-04 DIAGNOSIS — R6889 Other general symptoms and signs: Secondary | ICD-10-CM

## 2017-10-04 MED ORDER — CLONAZEPAM 0.5 MG PO TABS: 0.5000 mg | ORAL_TABLET | Freq: Two times a day (BID) | ORAL | 5 refills | Status: DC

## 2017-10-04 MED ORDER — METHYLPREDNISOLONE SODIUM SUCC 125 MG IJ SOLR
125.0000 mg | Freq: Once | INTRAMUSCULAR | Status: AC
  Administered 2017-10-04: 125 mg via INTRAMUSCULAR

## 2017-10-04 NOTE — Progress Notes (Signed)
Subjective:     Paula Tucker is a 42 y.o. female and is here for a comprehensive physical exam. The patient reports problems - she has had some chronic throat clearing for last 6 months. she has been to allergist and found not to have any allergies. she sees ENT regularly after her surgery. she is doing nasal steroid sinuses rinses and sprays. the nasal rinses and sprays help a lot but have not helped with throat clearning. she denis any acid reflux symptoms. she denies any wheezing. she uses advair daily. she has yet to use rescue inhaler. no coughing. she does notice it more in the afternoon. Not notice any foods or medications or environments that trigger.   Social History   Socioeconomic History  . Marital status: Married    Spouse name: Not on file  . Number of children: Not on file  . Years of education: Not on file  . Highest education level: Not on file  Occupational History  . Not on file  Social Needs  . Financial resource strain: Not on file  . Food insecurity:    Worry: Not on file    Inability: Not on file  . Transportation needs:    Medical: Not on file    Non-medical: Not on file  Tobacco Use  . Smoking status: Never Smoker  . Smokeless tobacco: Never Used  Substance and Sexual Activity  . Alcohol use: No  . Drug use: No  . Sexual activity: Yes    Birth control/protection: Pill  Lifestyle  . Physical activity:    Days per week: Not on file    Minutes per session: Not on file  . Stress: Not on file  Relationships  . Social connections:    Talks on phone: Not on file    Gets together: Not on file    Attends religious service: Not on file    Active member of club or organization: Not on file    Attends meetings of clubs or organizations: Not on file    Relationship status: Not on file  . Intimate partner violence:    Fear of current or ex partner: Not on file    Emotionally abused: Not on file    Physically abused: Not on file    Forced sexual activity: Not  on file  Other Topics Concern  . Not on file  Social History Narrative  . Not on file   Health Maintenance  Topic Date Due  . INFLUENZA VACCINE  10/07/2017 (Originally 08/11/2017)  . HIV Screening  10/05/2018 (Originally 11/14/1992)  . PAP SMEAR  09/12/2018  . TETANUS/TDAP  07/27/2021    The following portions of the patient's history were reviewed and updated as appropriate: allergies, current medications, past family history, past medical history, past social history, past surgical history and problem list.  Review of Systems Pertinent items noted in HPI and remainder of comprehensive ROS otherwise negative.   Objective:    BP 133/80   Pulse 84   Ht 5\' 1"  (1.549 m)   Wt 223 lb (101.2 kg)   BMI 42.14 kg/m  General appearance: alert, cooperative, appears stated age and morbidly obese Head: Normocephalic, without obvious abnormality, atraumatic Eyes: conjunctivae/corneas clear. PERRL, EOM's intact. Fundi benign. Ears: normal TM's and external ear canals both ears Nose: Nares normal. Septum midline. Mucosa normal. No drainage or sinus tenderness. Throat: lips, mucosa, and tongue normal; teeth and gums normal Neck: no adenopathy, no carotid bruit, no JVD, supple, symmetrical, trachea midline  and thyroid not enlarged, symmetric, no tenderness/mass/nodules Back: symmetric, no curvature. ROM normal. No CVA tenderness. Lungs: clear to auscultation bilaterally Heart: regular rate and rhythm, S1, S2 normal, no murmur, click, rub or gallop Abdomen: soft, non-tender; bowel sounds normal; no masses,  no organomegaly Extremities: extremities normal, atraumatic, no cyanosis or edema Pulses: 2+ and symmetric Skin: Skin color, texture, turgor normal. No rashes or lesions Lymph nodes: Cervical, supraclavicular, and axillary nodes normal. Neurologic: Alert and oriented X 3, normal strength and tone. Normal symmetric reflexes. Normal coordination and gait    Assessment:    Healthy female  exam.      Plan:    .Marland KitchenAki was seen today for annual exam.  Diagnoses and all orders for this visit:  Routine physical examination  Throat clearing -     methylPREDNISolone sodium succinate (SOLU-MEDROL) 125 mg/2 mL injection 125 mg  Visit for screening mammogram -     MM 3D SCREEN BREAST BILATERAL  Other orders -     clonazePAM (KLONOPIN) 0.5 MG tablet; Take 1 tablet (0.5 mg total) by mouth 2 (two) times daily.   .. Depression screen [facility] 2/9 09/19/2016 04/14/2016 03/10/2015  Decreased Interest 0 1 1  Down, Depressed, Hopeless 0 1 1  PHQ - 2 Score 0 2 2  Altered sleeping - - 0  Tired, decreased energy - - 2  Change in appetite - - 0  Feeling bad or failure about yourself  - - 1  Trouble concentrating - - 0  Moving slowly or fidgety/restless - - 0  Suicidal thoughts - - 0  PHQ-9 Score - - 5   .Marland Kitchen Discussed 150 minutes of exercise a week.  Encouraged vitamin D 1000 units and Calcium 1300mg  or 4 servings of dairy a day.  Mammogram ordered for after nov.  Pap up to date.  Declines STD testing.  Flu shot given today.  Fasting labs up to date.   I see no reason on exam today for throat clearing. She has no known allergies. She follows up with ENT in near future. Solumedrol shot given in office today to see if helps with symptoms. If does we know likely some type of inflammation. She is not having any GI symptoms as well. Look for triggers environmental and or food.  See After Visit Summary for Counseling Recommendations

## 2017-10-04 NOTE — Patient Instructions (Signed)

## 2017-10-05 ENCOUNTER — Encounter: Payer: Self-pay | Admitting: Physician Assistant

## 2017-10-05 DIAGNOSIS — F419 Anxiety disorder, unspecified: Secondary | ICD-10-CM | POA: Insufficient documentation

## 2017-10-07 ENCOUNTER — Encounter: Payer: BC Managed Care – PPO | Admitting: Physician Assistant

## 2017-10-14 ENCOUNTER — Encounter: Payer: Self-pay | Admitting: Sports Medicine

## 2017-10-14 ENCOUNTER — Ambulatory Visit (INDEPENDENT_AMBULATORY_CARE_PROVIDER_SITE_OTHER): Payer: BC Managed Care – PPO | Admitting: Sports Medicine

## 2017-10-14 DIAGNOSIS — H6992 Unspecified Eustachian tube disorder, left ear: Secondary | ICD-10-CM | POA: Insufficient documentation

## 2017-10-14 DIAGNOSIS — H6982 Other specified disorders of Eustachian tube, left ear: Secondary | ICD-10-CM

## 2017-10-14 NOTE — Patient Instructions (Signed)
Eustachian Tube Dysfunction The eustachian tube connects the middle ear to the back of the nose. It regulates air pressure in the middle ear by allowing air to move between the ear and nose. It also helps to drain fluid from the middle ear space. When the eustachian tube does not function properly, air pressure, fluid, or both can build up in the middle ear. Eustachian tube dysfunction can affect one or both ears. What are the causes? This condition happens when the eustachian tube becomes blocked or cannot open normally. This may result from:  Ear infections.  Colds and other upper respiratory infections.  Allergies.  Irritation, such as from cigarette smoke or acid from the stomach coming up into the esophagus (gastroesophageal reflux).  Sudden changes in air pressure, such as from descending in an airplane.  Abnormal growths in the nose or throat, such as nasal polyps, tumors, or enlarged tissue at the back of the throat (adenoids).  What increases the risk? This condition may be more likely to develop in people who smoke and people who are overweight. Eustachian tube dysfunction may also be more likely to develop in children, especially children who have:  Certain birth defects of the mouth, such as cleft palate.  Large tonsils and adenoids.  What are the signs or symptoms? Symptoms of this condition may include:  A feeling of fullness in the ear.  Ear pain.  Clicking or popping noises in the ear.  Ringing in the ear.  Hearing loss.  Loss of balance.  Symptoms may get worse when the air pressure around you changes, such as when you travel to an area of high elevation or fly on an airplane. How is this diagnosed? This condition may be diagnosed based on:  Your symptoms.  A physical exam of your ear, nose, and throat.  Tests, such as those that measure: ? The movement of your eardrum (tympanogram). ? Your hearing (audiometry).  How is this treated? Treatment  depends on the cause and severity of your condition. If your symptoms are mild, you may be able to relieve your symptoms by moving air into ("popping") your ears. If you have symptoms of fluid in your ears, treatment may include:  Decongestants.  Antihistamines.  Nasal sprays or ear drops that contain medicines that reduce swelling (steroids).  In some cases, you may need to have a procedure to drain the fluid in your eardrum (myringotomy). In this procedure, a small tube is placed in the eardrum to:  Drain the fluid.  Restore the air in the middle ear space.  Follow these instructions at home:  Take over-the-counter and prescription medicines only as told by your health care provider.  Use techniques to help pop your ears as recommended by your health care provider. These may include: ? Chewing gum. ? Yawning. ? Frequent, forceful swallowing. ? Closing your mouth, holding your nose closed, and gently blowing as if you are trying to blow air out of your nose.  Do not do any of the following until your health care provider approves: ? Travel to high altitudes. ? Fly in airplanes. ? Work in a pressurized cabin or room. ? Scuba dive.  Keep your ears dry. Dry your ears completely after showering or bathing.  Do not smoke.  Keep all follow-up visits as told by your health care provider. This is important. Contact a health care provider if:  Your symptoms do not go away after treatment.  Your symptoms come back after treatment.  You are   unable to pop your ears.  You have: ? A fever. ? Pain in your ear. ? Pain in your head or neck. ? Fluid draining from your ear.  Your hearing suddenly changes.  You become very dizzy.  You lose your balance. This information is not intended to replace advice given to you by your health care provider. Make sure you discuss any questions you have with your health care provider. Document Released: 01/24/2015 Document Revised: 06/05/2015  Document Reviewed: 01/16/2014 Elsevier Interactive Patient Education  2018 [employer]vier Inc.  

## 2017-10-14 NOTE — Assessment & Plan Note (Addendum)
History of sinus surgery, patient is unsure exactly what type. She is using nasal mometasone, continue this. Can get over-the-counter decongestants, exam is benign and unremarkable. Patience is going to be required here. Return to see [place] as needed. If persistence of discomfort for more than a month we will proceed with advanced imaging.

## 2017-10-14 NOTE — Progress Notes (Signed)
Subjective:    CC: Left ear fullness  HPI: This is a pleasant 42 year old female with a history of sinusitis, chronic and recurrent post sinus surgery, historically has done well.  Over the past few days she has had pressure and pain in her left ear, mild with some change in her hearing, tells me it sounds like she is listening through a seashell.  No nasal discharge, no fevers, chills, mild runny nose, mild cough.  I reviewed the past medical history, family history, social history, surgical history, and allergies today and no changes were needed.  Please see the problem list section below in epic for further details.  Past Medical History: Past Medical History:  Diagnosis Date  . Anxiety   . Asthma    Past Surgical History: Past Surgical History:  Procedure Laterality Date  . cyst on neck     Social History: Social History   Socioeconomic History  . Marital status: Married    Spouse name: Not on file  . Number of children: Not on file  . Years of education: Not on file  . Highest education level: Not on file  Occupational History  . Not on file  Social Needs  . Financial resource strain: Not on file  . Food insecurity:    Worry: Not on file    Inability: Not on file  . Transportation needs:    Medical: Not on file    Non-medical: Not on file  Tobacco Use  . Smoking status: Never Smoker  . Smokeless tobacco: Never Used  Substance and Sexual Activity  . Alcohol use: No  . Drug use: No  . Sexual activity: Yes    Birth control/protection: Pill  Lifestyle  . Physical activity:    Days per week: Not on file    Minutes per session: Not on file  . Stress: Not on file  Relationships  . Social connections:    Talks on phone: Not on file    Gets together: Not on file    Attends religious service: Not on file    Active member of club or organization: Not on file    Attends meetings of clubs or organizations: Not on file    Relationship status: Not on file  Other  Topics Concern  . Not on file  Social History Narrative  . Not on file   Family History: Family History  Problem Relation Age of Onset  . Heart failure Mother   . Depression Father   . Cancer Maternal Aunt        Melanoma  . Depression Maternal Aunt   . Cancer Paternal Aunt        Breast Cancer  . Diabetes Maternal Grandmother   . Cancer Maternal Grandfather        Melanoma  . Diabetes Maternal Grandfather   . Stroke Maternal Grandfather    Allergies: No Known Allergies Medications: See med rec.  Review of Systems: No fevers, chills, night sweats, weight loss, chest pain, or shortness of breath.   Objective:    General: Well Developed, well nourished, and in no acute distress.  Neuro: Alert and oriented x3, extra-ocular muscles intact, sensation grossly intact.  HEENT: Normocephalic, atraumatic, pupils equal round reactive to light, neck supple, no masses, no lymphadenopathy, thyroid nonpalpable.  Oropharynx, nasopharynx, ear canals unremarkable, no tenderness over the frontal or maxillary sinuses.  No tenderness over the mastoid processes. Skin: Warm and dry, no rashes. Cardiac: Regular rate and rhythm, no murmurs rubs or  gallops, no lower extremity edema.  Respiratory: Clear to auscultation bilaterally. Not using accessory muscles, speaking in full sentences.  Impression and Recommendations:    Acute dysfunction of Eustachian tube, left History of sinus surgery, patient is unsure exactly what type. She is using nasal mometasone, continue this. Can get over-the-counter decongestants, exam is benign and unremarkable. Patience is going to be required here. Return to see [place] as needed. If persistence of discomfort for more than a month we will proceed with advanced imaging. ___________________________________________ Ihor Austin. Benjamin Stain, M.D., ABFM., CAQSM. Primary Care and Sports Medicine [facility]  Adjunct Instructor of Family Medicine    [employer]

## 2017-10-17 ENCOUNTER — Encounter: Payer: Self-pay | Admitting: Physician Assistant

## 2017-10-17 ENCOUNTER — Ambulatory Visit (INDEPENDENT_AMBULATORY_CARE_PROVIDER_SITE_OTHER): Payer: BC Managed Care – PPO | Admitting: Physician Assistant

## 2017-10-17 VITALS — BP 158/102 | HR 111 | Ht 61.0 in | Wt 221.0 lb

## 2017-10-17 DIAGNOSIS — R42 Dizziness and giddiness: Secondary | ICD-10-CM

## 2017-10-17 MED ORDER — METHYLPREDNISOLONE 4 MG PO TBPK: ORAL_TABLET | ORAL | 0 refills | Status: DC

## 2017-10-18 ENCOUNTER — Encounter: Payer: Self-pay | Admitting: Physician Assistant

## 2017-10-18 DIAGNOSIS — R42 Dizziness and giddiness: Secondary | ICD-10-CM | POA: Insufficient documentation

## 2017-10-18 NOTE — Progress Notes (Signed)
   Subjective:    Patient ID: Paula Tucker, female    DOB: 06-08-1975, 42 y.o.   MRN: [id number]  HPI Pt is a 42 yo female with 3 days of dizziness since being here on Friday. She was seen by Dr. Karie Schwalbe and dx with ETD. She was giver reassurance. When she left office she started to feel dizzy. Position changes make dizziness worse. She has no problems walking but when she changes her head position too fast she can get dizzy. Her ear pain has resolved. She continues to have muffled hearing in left ear.   .. Active Ambulatory Problems    Diagnosis Date Noted  . Anxiety and depression 06/14/2011  . Asthma 06/14/2011  . Hypertension 07/29/2011  . Migraine 03/04/2012  . Skin nodule 03/10/2015  . 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 Abnormal otoscopic exam of left ear 02/04/2016  . Sinusitis with nasal polyps 04/14/2016  . Morbid obesity (HCC) 09/19/2016  . Anxiety 10/05/2017  . Acute dysfunction of Eustachian tube, left 10/14/2017   Resolved Ambulatory Problems    Diagnosis Date Noted  . Ear pressure, left 02/04/2016   Past Medical History:  Diagnosis Date  . Asthma       Review of Systems See HPI.     Objective:   Physical Exam  Constitutional: She is oriented to person, place, and time. She appears well-developed and well-nourished.  HENT:  Head: Normocephalic and atraumatic.  Right Ear: External ear normal.  Left Ear: External ear normal.  Nose: Nose normal.  Mouth/Throat: Oropharynx is clear and moist.  TM's normal.  Negative for sinus tenderness to palpation.   Eyes: Pupils are equal, round, and reactive to light. Conjunctivae and EOM are normal. Right eye exhibits no discharge. Left eye exhibits no discharge.  Cardiovascular: Normal rate and regular rhythm.  Pulmonary/Chest: Effort normal and breath sounds normal. She has no wheezes.  Lymphadenopathy:    She has no cervical adenopathy.  Neurological: She is alert and oriented to person, place, and time. Coordination  normal.  Negative Dix Hallpike maneuver.  Negative rhomberg.  Gait normal but walks a little slower trying to not turn her head.   Psychiatric: She has a normal mood and affect. Her behavior is normal.          Assessment & Plan:  Marland KitchenMarland KitchenDiagnoses and all orders for this visit:  Vertigo -     methylPREDNISolone (MEDROL DOSEPAK) 4 MG TBPK tablet; Take as directed by package insert.   Symptoms consistent with BPPV despite negative dix hallpike negative. Discussed epley manuevers 3 round TID. Continue nasonex. Start medrol dose pak. If dizziness not improving or worsening will consider vestibular rehab. No red flag signs today. Written out of work for today and tomorrow. Ok to return when feeling better.

## 2018-03-31 ENCOUNTER — Ambulatory Visit: Payer: BC Managed Care – PPO | Admitting: Physician Assistant

## 2018-04-13 ENCOUNTER — Other Ambulatory Visit: Payer: Self-pay | Admitting: Physician Assistant

## 2018-04-13 DIAGNOSIS — J4520 Mild intermittent asthma, uncomplicated: Secondary | ICD-10-CM

## 2018-05-01 ENCOUNTER — Other Ambulatory Visit: Payer: Self-pay | Admitting: Physician Assistant

## 2018-05-01 DIAGNOSIS — Z1231 Encounter for screening mammogram for malignant neoplasm of breast: Secondary | ICD-10-CM

## 2018-05-01 NOTE — Telephone Encounter (Signed)
Pt not seen since October  Called and left pt msg on ID'd VM that appt is needed for refill- advised pt that virtual appts are available

## 2018-05-02 ENCOUNTER — Telehealth (INDEPENDENT_AMBULATORY_CARE_PROVIDER_SITE_OTHER): Payer: BC Managed Care – PPO | Admitting: Physician Assistant

## 2018-05-02 ENCOUNTER — Encounter: Payer: Self-pay | Admitting: Physician Assistant

## 2018-05-02 VITALS — BP 110/85 | HR 80 | Ht 61.0 in | Wt 215.0 lb

## 2018-05-02 DIAGNOSIS — F329 Major depressive disorder, single episode, unspecified: Secondary | ICD-10-CM

## 2018-05-02 DIAGNOSIS — F419 Anxiety disorder, unspecified: Secondary | ICD-10-CM

## 2018-05-02 DIAGNOSIS — Z1231 Encounter for screening mammogram for malignant neoplasm of breast: Secondary | ICD-10-CM | POA: Diagnosis not present

## 2018-05-02 MED ORDER — CLONAZEPAM 0.5 MG PO TABS: 0.5000 mg | ORAL_TABLET | Freq: Two times a day (BID) | ORAL | 5 refills | Status: DC

## 2018-05-02 MED ORDER — PAROXETINE HCL 20 MG PO TABS: 20.0000 mg | ORAL_TABLET | Freq: Every day | ORAL | 3 refills | Status: DC

## 2018-05-02 NOTE — Progress Notes (Deleted)
*  due for mammogram*  -PHQ/GAD completed  -refills pended

## 2018-05-02 NOTE — Progress Notes (Signed)
Patient ID: Paula Tucker, female   DOB: November 17, 1975, 43 y.o.   MRN: [id number] .Marland KitchenVirtual Visit via Video Note  I connected with Paula Tucker on 05/02/18 at 11:30 AM EDT by a video enabled telemedicine application and verified that I am speaking with the correct person using two identifiers.   I discussed the limitations of evaluation and management by telemedicine and the availability of in person appointments. The patient expressed understanding and agreed to proceed.  History of Present Illness: Pt is a 43 yo female with anxiety and depression who calls into clinic for refills. She is doing great. No concerns or complaints. No SI/HC. No side effects. She is using klonapin most days but keeps her panic attacks non-existent.    .. Active Ambulatory Problems    Diagnosis Date Noted  . Anxiety and depression 06/14/2011  . Asthma 06/14/2011  . Hypertension 07/29/2011  . Migraine 03/04/2012  . Skin nodule 03/10/2015  . 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 Abnormal otoscopic exam of left ear 02/04/2016  . Sinusitis with nasal polyps 04/14/2016  . Morbid obesity (HCC) 09/19/2016  . Anxiety 10/05/2017  . Acute dysfunction of Eustachian tube, left 10/14/2017  . Vertigo 10/18/2017   Resolved Ambulatory Problems    Diagnosis Date Noted  . Ear pressure, left 02/04/2016   Past Medical History:  Diagnosis Date  . Asthma    Reviewed med, allergy and problem list.    Observations/Objective: No acute distress.  Normal mood.   .. Today's Vitals   05/02/18 1057  BP: 110/85  Pulse: 80  Weight: 215 lb (97.5 kg)  Height: 5\' 1"  (1.549 m)   Body mass index is 40.62 kg/m.  .. Depression screen [facility] 2/9 05/02/2018 09/19/2016 04/14/2016 03/10/2015  Decreased Interest 0 0 1 1  Down, Depressed, Hopeless 0 0 1 1  PHQ - 2 Score 0 0 2 2  Altered sleeping 0 - - 0  Tired, decreased energy 0 - - 2  Change in appetite 0 - - 0  Feeling bad or failure about yourself  0 - - 1  Trouble concentrating 0 - - 0   Moving slowly or fidgety/restless 0 - - 0  Suicidal thoughts 0 - - 0  PHQ-9 Score 0 - - 5  Difficult doing work/chores Not difficult at all - - -   .Marland Kitchen GAD 7 : Generalized Anxiety Score 05/02/2018 03/10/2015  Nervous, Anxious, on Edge 1 1  Control/stop worrying 0 0  Worry too much - different things 0 2  Trouble relaxing 0 2  Restless 0 0  Easily annoyed or irritable 0 1  Afraid - awful might happen 0 0  Total GAD 7 Score 1 6  Anxiety Difficulty Not difficult at all Not difficult at all      Assessment and Plan: .Marland KitchenPersephanie was seen today for medication refill.  Diagnoses and all orders for this visit:  Anxiety and depression -     PARoxetine (PAXIL) 20 MG tablet; Take 1 tablet (20 mg total) by mouth daily. -     clonazePAM (KLONOPIN) 0.5 MG tablet; Take 1 tablet (0.5 mg total) by mouth 2 (two) times daily.  Visit for screening mammogram -     Discontinue: clonazePAM (KLONOPIN) 0.5 MG tablet; Take 1 tablet (0.5 mg total) by mouth 2 (two) times daily. -     MM 3D SCREEN BREAST BILATERAL   Ordered placed for mammogram.  Pt doing great. Refills for 6 months.   Follow Up Instructions:  I discussed the assessment and treatment plan with the patient. The patient was provided an opportunity to ask questions and all were answered. The patient agreed with the plan and demonstrated an understanding of the instructions.   The patient was advised to call back or seek an in-person evaluation if the symptoms worsen or if the condition fails to improve as anticipated.  I provided 15  minutes of non-face-to-face time during this encounter.   Tandy GawJade Breeback, PA-C

## 2018-05-09 ENCOUNTER — Other Ambulatory Visit: Payer: Self-pay | Admitting: Physician Assistant

## 2018-05-09 DIAGNOSIS — J4520 Mild intermittent asthma, uncomplicated: Secondary | ICD-10-CM

## 2018-06-01 ENCOUNTER — Other Ambulatory Visit: Payer: Self-pay | Admitting: *Deleted

## 2018-06-01 ENCOUNTER — Other Ambulatory Visit: Payer: Self-pay | Admitting: Physician Assistant

## 2018-06-01 DIAGNOSIS — J4520 Mild intermittent asthma, uncomplicated: Secondary | ICD-10-CM

## 2018-10-12 ENCOUNTER — Ambulatory Visit (INDEPENDENT_AMBULATORY_CARE_PROVIDER_SITE_OTHER): Payer: BC Managed Care – PPO

## 2018-10-12 ENCOUNTER — Other Ambulatory Visit: Payer: Self-pay

## 2018-10-12 DIAGNOSIS — Z1231 Encounter for screening mammogram for malignant neoplasm of breast: Secondary | ICD-10-CM

## 2018-10-13 NOTE — Progress Notes (Signed)
Let patient know she should be contacted about possible left breast mass for more imaging.

## 2018-10-16 ENCOUNTER — Other Ambulatory Visit: Payer: Self-pay | Admitting: Physician Assistant

## 2018-10-16 DIAGNOSIS — R928 Other abnormal and inconclusive findings on diagnostic imaging of breast: Secondary | ICD-10-CM

## 2018-10-17 ENCOUNTER — Ambulatory Visit
Admission: RE | Admit: 2018-10-17 | Discharge: 2018-10-17 | Disposition: A | Discharge status: Home / Self Care | Payer: BC Managed Care – PPO | Source: Ambulatory Visit | Attending: Physician Assistant | Admitting: Physician Assistant

## 2018-10-17 ENCOUNTER — Other Ambulatory Visit: Payer: Self-pay

## 2018-10-17 DIAGNOSIS — R928 Other abnormal and inconclusive findings on diagnostic imaging of breast: Secondary | ICD-10-CM

## 2018-10-18 ENCOUNTER — Encounter: Payer: Self-pay | Admitting: Physician Assistant

## 2018-10-18 DIAGNOSIS — N6002 Solitary cyst of left breast: Secondary | ICD-10-CM | POA: Insufficient documentation

## 2018-10-18 NOTE — Progress Notes (Signed)
GREAT news. Benign cyst of left breast. Continue annual mammograms.

## 2018-11-03 ENCOUNTER — Encounter: Payer: Self-pay | Admitting: Physician Assistant

## 2018-11-03 ENCOUNTER — Other Ambulatory Visit: Payer: Self-pay

## 2018-11-03 ENCOUNTER — Ambulatory Visit (INDEPENDENT_AMBULATORY_CARE_PROVIDER_SITE_OTHER): Payer: BC Managed Care – PPO | Admitting: Physician Assistant

## 2018-11-03 ENCOUNTER — Other Ambulatory Visit (HOSPITAL_COMMUNITY)
Admission: RE | Admit: 2018-11-03 | Discharge: 2018-11-03 | Disposition: A | Discharge status: Home / Self Care | Payer: BC Managed Care – PPO | Source: Ambulatory Visit | Attending: Physician Assistant | Admitting: Physician Assistant

## 2018-11-03 VITALS — BP 149/88 | HR 92 | Ht 60.0 in | Wt 220.0 lb

## 2018-11-03 DIAGNOSIS — Z124 Encounter for screening for malignant neoplasm of cervix: Secondary | ICD-10-CM

## 2018-11-03 DIAGNOSIS — Z131 Encounter for screening for diabetes mellitus: Secondary | ICD-10-CM

## 2018-11-03 DIAGNOSIS — F419 Anxiety disorder, unspecified: Secondary | ICD-10-CM

## 2018-11-03 DIAGNOSIS — Z0000 Encounter for general adult medical examination without abnormal findings: Secondary | ICD-10-CM

## 2018-11-03 DIAGNOSIS — F329 Major depressive disorder, single episode, unspecified: Secondary | ICD-10-CM

## 2018-11-03 DIAGNOSIS — R030 Elevated blood-pressure reading, without diagnosis of hypertension: Secondary | ICD-10-CM

## 2018-11-03 MED ORDER — CLONAZEPAM 0.5 MG PO TABS: 0.5000 mg | ORAL_TABLET | Freq: Two times a day (BID) | ORAL | 5 refills | Status: DC

## 2018-11-03 NOTE — Progress Notes (Signed)
Subjective:     Paula Tucker is a 43 y.o. female and is here for a comprehensive physical exam. The patient reports no problems.   Social History   Socioeconomic History  . Marital status: Married    Spouse name: Not on file  . Number of children: Not on file  . Years of education: Not on file  . Highest education level: Not on file  Occupational History  . Not on file  Social Needs  . Financial resource strain: Not on file  . Food insecurity    Worry: Not on file    Inability: Not on file  . Transportation needs    Medical: Not on file    Non-medical: Not on file  Tobacco Use  . Smoking status: Never Smoker  . Smokeless tobacco: Never Used  Substance and Sexual Activity  . Alcohol use: No  . Drug use: No  . Sexual activity: Yes    Birth control/protection: Pill  Lifestyle  . Physical activity    Days per week: Not on file    Minutes per session: Not on file  . Stress: Not on file  Relationships  . Social Herbalist on phone: Not on file    Gets together: Not on file    Attends religious service: Not on file    Active member of club or organization: Not on file    Attends meetings of clubs or organizations: Not on file    Relationship status: Not on file  . Intimate partner violence    Fear of current or ex partner: Not on file    Emotionally abused: Not on file    Physically abused: Not on file    Forced sexual activity: Not on file  Other Topics Concern  . Not on file  Social History Narrative  . Not on file   Health Maintenance  Topic Date Due  . PAP SMEAR-Modifier  09/12/2018  . INFLUENZA VACCINE  04/11/2019 (Originally 08/12/2018)  . HIV Screening  11/03/2019 (Originally 11/14/1992)  . MAMMOGRAM  10/12/2019  . TETANUS/TDAP  07/27/2021    The following portions of the patient's history were reviewed and updated as appropriate: allergies, current medications, past family history, past medical history, past social history, past surgical history  and problem list.  Review of Systems A comprehensive review of systems was negative.   Objective:    BP (!) 149/88   Pulse 92   Ht 5' (1.524 m)   Wt 220 lb (99.8 kg)   SpO2 96%   BMI 42.97 kg/m  General appearance: alert, cooperative, appears stated age and morbidly obese Head: Normocephalic, without obvious abnormality, atraumatic Eyes: conjunctivae/corneas clear. PERRL, EOM's intact. Fundi benign. Ears: normal TM's and external ear canals both ears Nose: Nares normal. Septum midline. Mucosa normal. No drainage or sinus tenderness. Throat: lips, mucosa, and tongue normal; teeth and gums normal Neck: no adenopathy, no carotid bruit, no JVD, supple, symmetrical, trachea midline and thyroid not enlarged, symmetric, no tenderness/mass/nodules Back: symmetric, no curvature. ROM normal. No CVA tenderness. Lungs: clear to auscultation bilaterally Heart: regular rate and rhythm, S1, S2 normal, no murmur, click, rub or gallop Abdomen: soft, non-tender; bowel sounds normal; no masses,  no organomegaly Pelvic: cervix normal in appearance, external genitalia normal, no adnexal masses or tenderness, no cervical motion tenderness, uterus normal size, shape, and consistency and vagina normal without discharge Extremities: extremities normal, atraumatic, no cyanosis or edema Pulses: 2+ and symmetric Skin: Skin color, texture, turgor  normal. No rashes or lesions Lymph nodes: Cervical, supraclavicular, and axillary nodes normal. Neurologic: Alert and oriented X 3, normal strength and tone. Normal symmetric reflexes. Normal coordination and gait    Assessment:    Healthy female exam.      Plan:     .Marland KitchenKeelie was seen today for annual exam.  Diagnoses and all orders for this visit:  Routine physical examination  Anxiety and depression -     clonazePAM (KLONOPIN) 0.5 MG tablet; Take 1 tablet (0.5 mg total) by mouth 2 (two) times daily.  Screening for diabetes mellitus -     COMPLETE  METABOLIC PANEL WITH GFR  Papanicolaou smear -     Cytology - PAP  Elevated blood pressure reading   .Marland Kitchen Depression screen [facility] 2/9 11/03/2018 05/02/2018 09/19/2016 04/14/2016 03/10/2015  Decreased Interest 1 0 0 1 1  Down, Depressed, Hopeless 1 0 0 1 1  PHQ - 2 Score 2 0 0 2 2  Altered sleeping 1 0 - - 0  Tired, decreased energy 1 0 - - 2  Change in appetite 0 0 - - 0  Feeling bad or failure about yourself  1 0 - - 1  Trouble concentrating 1 0 - - 0  Moving slowly or fidgety/restless 0 0 - - 0  Suicidal thoughts 0 0 - - 0  PHQ-9 Score 6 0 - - 5  Difficult doing work/chores Not difficult at all Not difficult at all - - -   .Marland Kitchen Discussed 150 minutes of exercise a week.  Encouraged vitamin D 1000 units and Calcium 1300mg  or 4 servings of dairy a day.  Fasting labs ordered.  Mammogram UTD. Pap done today. Declined STD.  Flu shot given.   Refilled klonapin.   BP elevated. Discussed checking at home. Discussed weight loss and low salt diet. Follow up in 6 months.  See After Visit Summary for Counseling Recommendations

## 2018-11-03 NOTE — Patient Instructions (Signed)
Health Maintenance, Female Adopting a healthy lifestyle and getting preventive care are important in promoting health and wellness. Ask your health care provider about:  The right schedule for you to have regular tests and exams.  Things you can do on your own to prevent diseases and keep yourself healthy. What should I know about diet, weight, and exercise? Eat a healthy diet   Eat a diet that includes plenty of vegetables, fruits, low-fat dairy products, and lean protein.  Do not eat a lot of foods that are high in solid fats, added sugars, or sodium. Maintain a healthy weight Body mass index (BMI) is used to identify weight problems. It estimates body fat based on height and weight. Your health care provider can help determine your BMI and help you achieve or maintain a healthy weight. Get regular exercise Get regular exercise. This is one of the most important things you can do for your health. Most adults should:  Exercise for at least 150 minutes each week. The exercise should increase your heart rate and make you sweat (moderate-intensity exercise).  Do strengthening exercises at least twice a week. This is in addition to the moderate-intensity exercise.  Spend less time sitting. Even light physical activity can be beneficial. Watch cholesterol and blood lipids Have your blood tested for lipids and cholesterol at 41 years of age, then have this test every 5 years. Have your cholesterol levels checked more often if:  Your lipid or cholesterol levels are high.  You are older than 40 years of age.  You are at high risk for heart disease. What should I know about cancer screening? Depending on your health history and family history, you may need to have cancer screening at various ages. This may include screening for:  Breast cancer.  Cervical cancer.  Colorectal cancer.  Skin cancer.  Lung cancer. What should I know about heart disease, diabetes, and high blood  pressure? Blood pressure and heart disease  High blood pressure causes heart disease and increases the risk of stroke. This is more likely to develop in people who have high blood pressure readings, are of African descent, or are overweight.  Have your blood pressure checked: ? Every 3-5 years if you are 18-39 years of age. ? Every year if you are 40 years old or older. Diabetes Have regular diabetes screenings. This checks your fasting blood sugar level. Have the screening done:  Once every three years after age 40 if you are at a normal weight and have a low risk for diabetes.  More often and at a younger age if you are overweight or have a high risk for diabetes. What should I know about preventing infection? Hepatitis B If you have a higher risk for hepatitis B, you should be screened for this virus. Talk with your health care provider to find out if you are at risk for hepatitis B infection. Hepatitis C Testing is recommended for:  Everyone born from 1945 through 1965.  Anyone with known risk factors for hepatitis C. Sexually transmitted infections (STIs)  Get screened for STIs, including gonorrhea and chlamydia, if: ? You are sexually active and are younger than 41 years of age. ? You are older than 41 years of age and your health care provider tells you that you are at risk for this type of infection. ? Your sexual activity has changed since you were last screened, and you are at increased risk for chlamydia or gonorrhea. Ask your health care provider if   you are at risk.  Ask your health care provider about whether you are at high risk for HIV. Your health care provider may recommend a prescription medicine to help prevent HIV infection. If you choose to take medicine to prevent HIV, you should first get tested for HIV. You should then be tested every 3 months for as long as you are taking the medicine. Pregnancy  If you are about to stop having your period (premenopausal) and  you may become pregnant, seek counseling before you get pregnant.  Take 400 to 800 micrograms (mcg) of folic acid every day if you become pregnant.  Ask for birth control (contraception) if you want to prevent pregnancy. Osteoporosis and menopause Osteoporosis is a disease in which the bones lose minerals and strength with aging. This can result in bone fractures. If you are 65 years old or older, or if you are at risk for osteoporosis and fractures, ask your health care provider if you should:  Be screened for bone loss.  Take a calcium or vitamin D supplement to lower your risk of fractures.  Be given hormone replacement therapy (HRT) to treat symptoms of menopause. Follow these instructions at home: Lifestyle  Do not use any products that contain nicotine or tobacco, such as cigarettes, e-cigarettes, and chewing tobacco. If you need help quitting, ask your health care provider.  Do not use street drugs.  Do not share needles.  Ask your health care provider for help if you need support or information about quitting drugs. Alcohol use  Do not drink alcohol if: ? Your health care provider tells you not to drink. ? You are pregnant, may be pregnant, or are planning to become pregnant.  If you drink alcohol: ? Limit how much you use to 0-1 drink a day. ? Limit intake if you are breastfeeding.  Be aware of how much alcohol is in your drink. In the [place] one drink equals one 12 oz bottle of beer (355 mL), one 5 oz glass of wine (148 mL), or one 1 oz glass of hard liquor (44 mL). General instructions  Schedule regular health, dental, and eye exams.  Stay current with your vaccines.  Tell your health care provider if: ? You often feel depressed. ? You have ever been abused or do not feel safe at home. Summary  Adopting a healthy lifestyle and getting preventive care are important in promoting health and wellness.  Follow your health care provider's instructions about healthy  diet, exercising, and getting tested or screened for diseases.  Follow your health care provider's instructions on monitoring your cholesterol and blood pressure. This information is not intended to replace advice given to you by your health care provider. Make sure you discuss any questions you have with your health care provider. Document Released: 07/13/2010 Document Revised: 12/21/2017 Document Reviewed: 12/21/2017 Elsevier Patient Education  2020 [employer]  

## 2018-11-05 ENCOUNTER — Encounter: Payer: Self-pay | Admitting: Physician Assistant

## 2018-11-08 LAB — CYTOLOGY - PAP
Comment: NEGATIVE
Diagnosis: NEGATIVE
High risk HPV: NEGATIVE

## 2018-11-08 NOTE — Progress Notes (Signed)
Negative HPV. Normal cells. Next pap 5 years.

## 2018-11-16 ENCOUNTER — Other Ambulatory Visit: Payer: Self-pay | Admitting: Physician Assistant

## 2018-11-16 DIAGNOSIS — J4520 Mild intermittent asthma, uncomplicated: Secondary | ICD-10-CM

## 2019-01-26 ENCOUNTER — Encounter: Payer: Self-pay | Admitting: Nurse Practitioner

## 2019-01-26 ENCOUNTER — Other Ambulatory Visit: Payer: Self-pay

## 2019-01-26 ENCOUNTER — Ambulatory Visit: Payer: BC Managed Care – PPO | Admitting: Nurse Practitioner

## 2019-01-26 VITALS — BP 144/85 | HR 82 | Resp 12 | Wt 228.0 lb

## 2019-01-26 DIAGNOSIS — T162XXA Foreign body in left ear, initial encounter: Secondary | ICD-10-CM | POA: Diagnosis not present

## 2019-01-26 NOTE — Progress Notes (Signed)
Acute Office Visit  Subjective:    Patient ID: Paula Tucker, female    DOB: 05-Jan-1976, 44 y.o.   MRN: [id number]  Chief Complaint  Patient presents with  . Foreign Body in Ear    HPI Patient is in today for removal of cotton from cotton tipped applicator from left ear. Pt was scratching left ear with a cotton tipped applicator a few days ago and when she removed the applicator she realized that some of the cotton was missing. She reports a sensation of fullness and occasional scratching sound in the ear.   Past Medical History:  Diagnosis Date  . Anxiety   . Asthma     Past Surgical History:  Procedure Laterality Date  . cyst on neck      Family History  Problem Relation Age of Onset  . Heart failure Mother   . Depression Father   . Cancer Maternal Aunt        Melanoma  . Depression Maternal Aunt   . Cancer Paternal Aunt        Breast Cancer  . Diabetes Maternal Grandmother   . Cancer Maternal Grandfather        Melanoma  . Diabetes Maternal Grandfather   . Stroke Maternal Grandfather     Social History   Socioeconomic History  . Marital status: Married    Spouse name: Not on file  . Number of children: Not on file  . Years of education: Not on file  . Highest education level: Not on file  Occupational History  . Not on file  Tobacco Use  . Smoking status: Never Smoker  . Smokeless tobacco: Never Used  Substance and Sexual Activity  . Alcohol use: No  . Drug use: No  . Sexual activity: Yes    Birth control/protection: Pill  Other Topics Concern  . Not on file  Social History Narrative  . Not on file   Social Determinants of Health   Financial Resource Strain:   . Difficulty of Paying Living Expenses: Not on file  Food Insecurity:   . Worried About Programme researcher, broadcasting/film/video in the Last Year: Not on file  . Ran Out of Food in the Last Year: Not on file  Transportation Needs:   . Lack of Transportation (Medical): Not on file  . Lack of Transportation  (Non-Medical): Not on file  Physical Activity:   . Days of Exercise per Week: Not on file  . Minutes of Exercise per Session: Not on file  Stress:   . Feeling of Stress : Not on file  Social Connections:   . Frequency of Communication with Friends and Family: Not on file  . Frequency of Social Gatherings with Friends and Family: Not on file  . Attends Religious Services: Not on file  . Active Member of Clubs or Organizations: Not on file  . Attends Banker Meetings: Not on file  . Marital Status: Not on file  Intimate Partner Violence:   . Fear of Current or Ex-Partner: Not on file  . Emotionally Abused: Not on file  . Physically Abused: Not on file  . Sexually Abused: Not on file    Outpatient Medications Prior to Visit  Medication Sig Dispense Refill  . BUDESONIDE NA ADD OF MEDICATION TO OF SALINE IN SALINE IRRIGATION BOTTLE; IRRIGATE SINUSES WITH THROUGH EACH NOSTRIL TWICE DAILY    . clonazePAM (KLONOPIN) 0.5 MG tablet Take 1 tablet (0.5 mg total) by mouth 2 (  two) times daily. 60 tablet 5  . PARoxetine (PAXIL) 20 MG tablet Take 1 tablet (20 mg total) by mouth daily. 90 tablet 3  . WIXELA INHUB 100-50 MCG/DOSE AEPB INHALE 1 PUFF BY MOUTH TWICE A DAY 60 each 4   No facility-administered medications prior to visit.    No Known Allergies  Review of Systems Denies ear pain, drainage, loss of hearing, or ringing in the ear.  Denies dizziness or loss of balance Denies fever     Objective:    Physical Exam Vitals reviewed.  Constitutional:      Appearance: Normal appearance.  HENT:     Head: Normocephalic and atraumatic.     Right Ear: Hearing, tympanic membrane, ear canal and external ear normal.     Left Ear: Hearing normal. No decreased hearing noted. No laceration, drainage, swelling or tenderness. A middle ear effusion is present. A foreign body is present. Tympanic membrane is not injected.     Ears:      Comments: Structural anomally  noted to the TM. Patient reports previous work-up from ENT and was told there is no cause for concern.   Eyes:     Conjunctiva/sclera: Conjunctivae normal.     Pupils: Pupils are equal, round, and reactive to light.  Pulmonary:     Effort: Pulmonary effort is normal.  Musculoskeletal:     Cervical back: Normal range of motion.  Skin:    General: Skin is warm and dry.  Neurological:     Mental Status: She is alert and oriented to person, place, and time.     There were no vitals taken for this visit. Wt Readings from Last 3 Encounters:  11/03/18 220 lb (99.8 kg)  05/02/18 215 lb (97.5 kg)  10/17/17 221 lb (100.2 kg)    There are no preventive care reminders to display for this patient.  There are no preventive care reminders to display for this patient.   Lab Results  Component Value Date   TSH 2.36 05/11/2017   Lab Results  Component Value Date   WBC 7.9 08/29/2014   HGB 12.9 08/29/2014   HCT 39.5 08/29/2014   MCV 84.4 08/29/2014   PLT 353 08/29/2014   Lab Results  Component Value Date   NA 138 05/11/2017   K 4.4 05/11/2017   CO2 27 05/11/2017   GLUCOSE 92 05/11/2017   BUN 12 05/11/2017   CREATININE 0.68 05/11/2017   BILITOT 0.3 05/11/2017   ALKPHOS 71 08/29/2014   AST 19 05/11/2017   ALT 18 05/11/2017   PROT 6.8 05/11/2017   ALBUMIN 3.9 08/29/2014   CALCIUM 9.2 05/11/2017   Lab Results  Component Value Date   CHOL 170 05/11/2017   Lab Results  Component Value Date   HDL 51 05/11/2017   Lab Results  Component Value Date   LDLCALC 103 (H) 05/11/2017   Lab Results  Component Value Date   TRIG 72 05/11/2017   Lab Results  Component Value Date   CHOLHDL 3.3 05/11/2017   No results found for: HGBA1C     Assessment & Plan:   1. Ear foreign body, left, initial encounter Gentle irrigation to left ear canal with warm water. 2 small cotton-like pieces flushed to the external ear and removed with gauze. No edema, erythema, or abrasions noted. TM  intact before and after procedure. Ear canal dried. Pt tolerated procedure well with no complications.  Pt to return to clinic if she experiences pain, redness, edema, or hearing  difficulties.     Tollie Eth, NP

## 2019-04-26 ENCOUNTER — Other Ambulatory Visit: Payer: Self-pay | Admitting: Physician Assistant

## 2019-04-26 DIAGNOSIS — F419 Anxiety disorder, unspecified: Secondary | ICD-10-CM

## 2019-04-26 DIAGNOSIS — J4520 Mild intermittent asthma, uncomplicated: Secondary | ICD-10-CM

## 2019-04-26 DIAGNOSIS — F329 Major depressive disorder, single episode, unspecified: Secondary | ICD-10-CM

## 2019-05-04 ENCOUNTER — Ambulatory Visit: Payer: BC Managed Care – PPO | Admitting: Physician Assistant

## 2019-05-04 ENCOUNTER — Encounter: Payer: Self-pay | Admitting: Nurse Practitioner

## 2019-05-04 ENCOUNTER — Ambulatory Visit (INDEPENDENT_AMBULATORY_CARE_PROVIDER_SITE_OTHER): Payer: BC Managed Care – PPO | Admitting: Nurse Practitioner

## 2019-05-04 ENCOUNTER — Other Ambulatory Visit: Payer: Self-pay

## 2019-05-04 DIAGNOSIS — F329 Major depressive disorder, single episode, unspecified: Secondary | ICD-10-CM | POA: Diagnosis not present

## 2019-05-04 DIAGNOSIS — F419 Anxiety disorder, unspecified: Secondary | ICD-10-CM

## 2019-05-04 MED ORDER — CLONAZEPAM 0.5 MG PO TABS: 0.5000 mg | ORAL_TABLET | Freq: Two times a day (BID) | ORAL | 5 refills | Status: DC

## 2019-05-04 MED ORDER — PAROXETINE HCL 20 MG PO TABS: 20.0000 mg | ORAL_TABLET | Freq: Every day | ORAL | 3 refills | Status: DC

## 2019-05-04 NOTE — Progress Notes (Signed)
Established Patient Office Visit  Subjective:  Patient ID: Paula Tucker, female    DOB: 1975/05/26  Age: 44 y.o. MRN: [id number]  CC:  Chief Complaint  Patient presents with  . Depression  . Anxiety    HPI Paula Tucker presents for follow-up for her anxiety and depression. 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 She feels she is doing well with this and has been managing her anxiety effectively. She is taking her clonazepam 1/2 tab in the morning and 1 full tab at bedtime. She feels this controls her anxiety symptoms well. She is taking 1 tab of paroxetine daily in the mornings and that controls her depressive symptoms well.   She denies chest pain, shortness of breath, palpitations, difficulty sleeping, decreased concentration, or daytime sleepiness.   Past Medical History:  Diagnosis Date  . Anxiety   . Asthma     Past Surgical History:  Procedure Laterality Date  . cyst on neck      Family History  Problem Relation Age of Onset  . Heart failure Mother   . Depression Father   . Cancer Maternal Aunt        Melanoma  . Depression Maternal Aunt   . Cancer Paternal Aunt        Breast Cancer  . Diabetes Maternal Grandmother   . Cancer Maternal Grandfather        Melanoma  . Diabetes Maternal Grandfather   . Stroke Maternal Grandfather     Social History   Socioeconomic History  . Marital status: Married    Spouse name: Not on file  . Number of children: Not on file  . Years of education: Not on file  . Highest education level: Not on file  Occupational History  . Not on file  Tobacco Use  . Smoking status: Never Smoker  . Smokeless tobacco: Never Used  Substance and Sexual Activity  . Alcohol use: No  . Drug use: No  . Sexual activity: Yes    Birth control/protection: Pill  Other Topics Concern  . Not on file  Social History Narrative  . Not on file   Social Determinants  of Health   Financial Resource Strain:   . Difficulty of Paying Living Expenses:   Food Insecurity:   . Worried About Programme researcher, broadcasting/film/video in the Last Year:   . Barista in the Last Year:   Transportation Needs:   . Freight forwarder (Medical):   Marland Kitchen Lack of Transportation (Non-Medical):   Physical Activity:   . Days of Exercise per Week:   . Minutes of Exercise per Session:   Stress:   . Feeling of Stress :   Social Connections:   . Frequency of Communication with Friends and Family:   . Frequency of Social Gatherings with Friends and Family:   . Attends Religious Services:   . Active Member of Clubs or Organizations:   . Attends Banker Meetings:   Marland Kitchen Marital Status:   Intimate Partner Violence:   . Fear of Current or Ex-Partner:   . Emotionally Abused:   Marland Kitchen Physically Abused:   . Sexually Abused:     Outpatient Medications Prior to Visit  Medication Sig Dispense Refill  . BUDESONIDE NA ADD OF MEDICATION TO OF SALINE IN SALINE IRRIGATION BOTTLE; IRRIGATE SINUSES WITH THROUGH EACH NOSTRIL TWICE DAILY    . clonazePAM (  KLONOPIN) 0.5 MG tablet Take 1 tablet (0.5 mg total) by mouth 2 (two) times daily. 60 tablet 5  . PARoxetine (PAXIL) 20 MG tablet Take 1 tablet (20 mg total) by mouth daily. Needs appt 90 tablet 0  . WIXELA INHUB 100-50 MCG/DOSE AEPB TAKE 1 PUFF BY MOUTH TWICE A DAY 60 each 11   No facility-administered medications prior to visit.    No Known Allergies  ROS Review of Systems  Constitutional: Negative for appetite change and fatigue.  Respiratory: Negative for cough, chest tightness and shortness of breath.   Cardiovascular: Negative for chest pain, palpitations and leg swelling.  Neurological: Negative for dizziness, tremors, weakness and headaches.  Psychiatric/Behavioral: Negative for agitation, decreased concentration, dysphoric mood, sleep disturbance and suicidal ideas. The patient is not nervous/anxious and is not  hyperactive.       Objective:    Physical Exam  Constitutional: She is oriented to person, place, and time. She appears well-developed and well-nourished.  HENT:  Head: Normocephalic.  Eyes: Pupils are equal, round, and reactive to light. Conjunctivae and EOM are normal.  Cardiovascular: Normal rate, regular rhythm and normal heart sounds.  Pulmonary/Chest: Effort normal and breath sounds normal.  Abdominal: Soft.  Musculoskeletal:        General: Normal range of motion.     Cervical back: Normal range of motion.  Neurological: She is alert and oriented to person, place, and time. She has normal reflexes.  Skin: Skin is warm and dry.  Psychiatric: She has a normal mood and affect. Her behavior is normal. Judgment and thought content normal.  Nursing note and vitals reviewed.   BP 117/85   Pulse (!) 103   Temp 98.3 F (36.8 C) (Oral)   Ht 5' (1.524 m)   Wt 220 lb 11.2 oz (100.1 kg)   LMP 04/16/2019   SpO2 96%   BMI 43.10 kg/m  Wt Readings from Last 3 Encounters:  05/04/19 220 lb 11.2 oz (100.1 kg)  01/26/19 228 lb (103.4 kg)  11/03/18 220 lb (99.8 kg)     There are no preventive care reminders to display for this patient.  There are no preventive care reminders to display for this patient.  Lab Results  Component Value Date   TSH 2.36 05/11/2017   Lab Results  Component Value Date   WBC 7.9 08/29/2014   HGB 12.9 08/29/2014   HCT 39.5 08/29/2014   MCV 84.4 08/29/2014   PLT 353 08/29/2014   Lab Results  Component Value Date   NA 138 05/11/2017   K 4.4 05/11/2017   CO2 27 05/11/2017   GLUCOSE 92 05/11/2017   BUN 12 05/11/2017   CREATININE 0.68 05/11/2017   BILITOT 0.3 05/11/2017   ALKPHOS 71 08/29/2014   AST 19 05/11/2017   ALT 18 05/11/2017   PROT 6.8 05/11/2017   ALBUMIN 3.9 08/29/2014   CALCIUM 9.2 05/11/2017   Lab Results  Component Value Date   CHOL 170 05/11/2017   Lab Results  Component Value Date   HDL 51 05/11/2017   Lab Results   Component Value Date   LDLCALC 103 (H) 05/11/2017   Lab Results  Component Value Date   TRIG 72 05/11/2017   Lab Results  Component Value Date   CHOLHDL 3.3 05/11/2017   No results found for: HGBA1C    Assessment & Plan:   1. Anxiety and depression Patient well controlled on current medication regimen for both anxiety and depression symptoms.  Continue current treatment as  prescribed.  Patient instructed to contact the office if she begins to experience worsening symptoms or new symptoms.  Follow-up in 6 months for medication management.  - clonazePAM (KLONOPIN) 0.5 MG tablet; Take 1 tablet (0.5 mg total) by mouth 2 (two) times daily.  Dispense: 60 tablet; Refill: 5 - PARoxetine (PAXIL) 20 MG tablet; Take 1 tablet (20 mg total) by mouth daily.  Dispense: 90 tablet; Refill: 3  Return in about 6 months (around 11/03/2019).  Tollie Eth, NP

## 2019-11-02 ENCOUNTER — Encounter: Payer: Self-pay | Admitting: Physician Assistant

## 2019-11-02 ENCOUNTER — Other Ambulatory Visit: Payer: Self-pay

## 2019-11-02 ENCOUNTER — Ambulatory Visit (INDEPENDENT_AMBULATORY_CARE_PROVIDER_SITE_OTHER): Payer: BC Managed Care – PPO | Admitting: Physician Assistant

## 2019-11-02 VITALS — BP 138/80 | HR 80 | Ht 60.0 in | Wt 223.0 lb

## 2019-11-02 DIAGNOSIS — F32A Depression, unspecified: Secondary | ICD-10-CM

## 2019-11-02 DIAGNOSIS — Z13220 Encounter for screening for lipoid disorders: Secondary | ICD-10-CM

## 2019-11-02 DIAGNOSIS — Z131 Encounter for screening for diabetes mellitus: Secondary | ICD-10-CM

## 2019-11-02 DIAGNOSIS — F419 Anxiety disorder, unspecified: Secondary | ICD-10-CM | POA: Diagnosis not present

## 2019-11-02 MED ORDER — CLONAZEPAM 0.5 MG PO TABS: 0.5000 mg | ORAL_TABLET | Freq: Two times a day (BID) | ORAL | 5 refills | Status: DC

## 2019-11-02 NOTE — Progress Notes (Signed)
Subjective:    Patient ID: Paula Tucker, female    DOB: 10/15/1975, 44 y.o.   MRN: [id number]  HPI  Pt is a 44 yo female with anxiety, migraines, hypertension who presents to the clinic for medication refill.  Patient is doing great.  She denies any problems with chest pain, palpitations, headaches, vision changes.  Her mood is well controlled.  She denies any significant problems with anxiety or depression.  She denies any suicidal thoughts or homicidal idealizations.  She is doing well on Klonopin.  She does not need any other refills.  .. Active Ambulatory Problems    Diagnosis Date Noted  . Anxiety and depression 06/14/2011  . Asthma 06/14/2011  . Hypertension 07/29/2011  . Migraine 03/04/2012  . Skin nodule 03/10/2015  . 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 Abnormal otoscopic exam of left ear 02/04/2016  . Sinusitis with nasal polyps 04/14/2016  . Morbid obesity (HCC) 09/19/2016  . Anxiety 10/05/2017  . Acute dysfunction of Eustachian tube, left 10/14/2017  . Vertigo 10/18/2017  . Benign breast cyst in female, left 10/18/2018   Resolved Ambulatory Problems    Diagnosis Date Noted  . Ear pressure, left 02/04/2016   Past Medical History:  Diagnosis Date  . Asthma        Review of Systems  All other systems reviewed and are negative.      Objective:   Physical Exam Vitals reviewed.  Constitutional:      Appearance: Normal appearance. She is obese.  Cardiovascular:     Rate and Rhythm: Normal rate and regular rhythm.     Pulses: Normal pulses.     Heart sounds: Normal heart sounds.  Pulmonary:     Effort: Pulmonary effort is normal.     Breath sounds: Normal breath sounds.  Neurological:     General: No focal deficit present.     Mental Status: She is alert and oriented to person, place, and time.  Psychiatric:        Mood and Affect: Mood normal.      .. GAD 7 : Generalized Anxiety Score 11/02/2019 05/04/2019 11/03/2018 05/02/2018  Nervous, Anxious,  on Edge 1 2 1 1   Control/stop worrying 1 1 1  0  Worry too much - different things 1 1 1  0  Trouble relaxing 1 1 1  0  Restless 0 0 0 0  Easily annoyed or irritable 1 1 1  0  Afraid - awful might happen 1 0 1 0  Total GAD 7 Score 6 6 6 1   Anxiety Difficulty Not difficult at all Not difficult at all Not difficult at all Not difficult at all    . Depression screen [facility] 2/9 11/02/2019 05/04/2019 11/03/2018 05/02/2018 09/19/2016  Decreased Interest 1 1 1  0 0  Down, Depressed, Hopeless 1 0 1 0 0  PHQ - 2 Score 2 1 2  0 0  Altered sleeping 0 0 1 0 -  Tired, decreased energy 2 1 1  0 -  Change in appetite 0 0 0 0 -  Feeling bad or failure about yourself  0 0 1 0 -  Trouble concentrating 1 1 1  0 -  Moving slowly or fidgety/restless 0 0 0 0 -  Suicidal thoughts 0 0 0 0 -  PHQ-9 Score 5 3 6  0 -  Difficult doing work/chores Not difficult at all Not difficult at all Not difficult at all Not difficult at all -        Assessment & Plan:  .4/910/24/2021  was seen today for follow-up.  Diagnoses and all orders for this visit:  Anxiety and depression -     clonazePAM (KLONOPIN) 0.5 MG tablet; Take 1 tablet (0.5 mg total) by mouth 2 (two) times daily. -     TSH -     CBC  Screening for diabetes mellitus -     COMPLETE METABOLIC PANEL WITH GFR  Screening for lipid disorders -     Lipid Panel w/reflex Direct LDL   Pt is doing great. Stable GAD. Refilled klonapin.  Needs screening labs.

## 2019-11-03 ENCOUNTER — Encounter: Payer: Self-pay | Admitting: Physician Assistant

## 2019-11-20 ENCOUNTER — Other Ambulatory Visit (HOSPITAL_BASED_OUTPATIENT_CLINIC_OR_DEPARTMENT_OTHER): Payer: Self-pay | Admitting: Physician Assistant

## 2019-11-20 DIAGNOSIS — Z1231 Encounter for screening mammogram for malignant neoplasm of breast: Secondary | ICD-10-CM

## 2019-11-21 ENCOUNTER — Other Ambulatory Visit: Payer: Self-pay

## 2019-11-21 ENCOUNTER — Ambulatory Visit: Payer: BC Managed Care – PPO

## 2019-11-21 DIAGNOSIS — Z1231 Encounter for screening mammogram for malignant neoplasm of breast: Secondary | ICD-10-CM

## 2019-11-26 NOTE — Progress Notes (Signed)
Paula Tucker,   Thyroid perfect.  No anemia.  Kidney, liver look great.  Fasting sugar is just a hair elevated. Please add A!C.  Cholesterol stable but LDL up some over the last 2 years.   Continue to stay active and keep low fat and low sugar diet.

## 2019-11-27 ENCOUNTER — Encounter: Payer: Self-pay | Admitting: Physician Assistant

## 2019-11-27 DIAGNOSIS — R7303 Prediabetes: Secondary | ICD-10-CM | POA: Insufficient documentation

## 2019-11-27 LAB — COMPLETE METABOLIC PANEL WITH GFR
AG Ratio: 1.4 (calc) (ref 1.0–2.5)
ALT: 18 U/L (ref 6–29)
AST: 18 U/L (ref 10–30)
Albumin: 3.8 g/dL (ref 3.6–5.1)
Alkaline phosphatase (APISO): 49 U/L (ref 31–125)
BUN: 9 mg/dL (ref 7–25)
CO2: 24 mmol/L (ref 20–32)
Calcium: 9.4 mg/dL (ref 8.6–10.2)
Chloride: 105 mmol/L (ref 98–110)
Creat: 0.54 mg/dL (ref 0.50–1.10)
GFR, Est African American: 135 mL/min/{1.73_m2} (ref >=60)
GFR, Est Non African American: 116 mL/min/{1.73_m2} (ref >=60)
Globulin: 2.8 g/dL (calc) (ref 1.9–3.7)
Glucose, Bld: 100 mg/dL — ABNORMAL HIGH (ref 65–99)
Potassium: 4.3 mmol/L (ref 3.5–5.3)
Sodium: 138 mmol/L (ref 135–146)
Total Bilirubin: 0.3 mg/dL (ref 0.2–1.2)
Total Protein: 6.6 g/dL (ref 6.1–8.1)

## 2019-11-27 LAB — CBC
HCT: 38.3 % (ref 35.0–45.0)
Hemoglobin: 12.7 g/dL (ref 11.7–15.5)
MCH: 28.2 pg (ref 27.0–33.0)
MCHC: 33.2 g/dL (ref 32.0–36.0)
MCV: 84.9 fL (ref 80.0–100.0)
MPV: 10.6 fL (ref 7.5–12.5)
Platelets: 340 10*3/uL (ref 140–400)
RBC: 4.51 10*6/uL (ref 3.80–5.10)
RDW: 12.8 % (ref 11.0–15.0)
WBC: 7.7 10*3/uL (ref 3.8–10.8)

## 2019-11-27 LAB — HEMOGLOBIN A1C W/OUT EAG: Hgb A1c MFr Bld: 5.8 % of total Hgb — ABNORMAL HIGH (ref <5.7)

## 2019-11-27 LAB — LIPID PANEL W/REFLEX DIRECT LDL
Cholesterol: 182 mg/dL (ref <200)
HDL: 52 mg/dL (ref >=50)
LDL Cholesterol (Calc): 111 mg/dL (calc) — ABNORMAL HIGH
Non-HDL Cholesterol (Calc): 130 mg/dL (calc) — ABNORMAL HIGH (ref <130)
Total CHOL/HDL Ratio: 3.5 (calc) (ref <5.0)
Triglycerides: 93 mg/dL (ref <150)

## 2019-11-27 LAB — TSH: TSH: 1.81 mIU/L

## 2019-11-27 NOTE — Progress Notes (Signed)
Paula Tucker,   A1C is 5.8. pre-diabetes. Continue to work on low carb/low sugar diet and regular exercise to keep this from increasing and going into diabetes. Recheck in 6 months.

## 2019-12-27 ENCOUNTER — Ambulatory Visit (INDEPENDENT_AMBULATORY_CARE_PROVIDER_SITE_OTHER): Payer: BC Managed Care – PPO

## 2019-12-27 ENCOUNTER — Other Ambulatory Visit: Payer: Self-pay

## 2019-12-27 DIAGNOSIS — Z1231 Encounter for screening mammogram for malignant neoplasm of breast: Secondary | ICD-10-CM

## 2020-01-01 NOTE — Progress Notes (Signed)
Normal mammogram. Follow up in 1 year.

## 2020-05-12 ENCOUNTER — Other Ambulatory Visit: Payer: Self-pay | Admitting: Physician Assistant

## 2020-05-12 DIAGNOSIS — J4520 Mild intermittent asthma, uncomplicated: Secondary | ICD-10-CM

## 2020-05-16 ENCOUNTER — Ambulatory Visit: Payer: BC Managed Care – PPO | Admitting: Physician Assistant

## 2020-05-20 ENCOUNTER — Ambulatory Visit (INDEPENDENT_AMBULATORY_CARE_PROVIDER_SITE_OTHER): Payer: BC Managed Care – PPO | Admitting: Physician Assistant

## 2020-05-20 ENCOUNTER — Encounter: Payer: Self-pay | Admitting: Physician Assistant

## 2020-05-20 ENCOUNTER — Other Ambulatory Visit: Payer: Self-pay

## 2020-05-20 VITALS — BP 131/82 | HR 95 | Temp 97.9°F | Resp 20 | Ht 60.0 in | Wt 217.0 lb

## 2020-05-20 DIAGNOSIS — F419 Anxiety disorder, unspecified: Secondary | ICD-10-CM | POA: Diagnosis not present

## 2020-05-20 DIAGNOSIS — J4520 Mild intermittent asthma, uncomplicated: Secondary | ICD-10-CM

## 2020-05-20 DIAGNOSIS — F32A Depression, unspecified: Secondary | ICD-10-CM

## 2020-05-20 DIAGNOSIS — R7303 Prediabetes: Secondary | ICD-10-CM

## 2020-05-20 LAB — POCT GLYCOSYLATED HEMOGLOBIN (HGB A1C): Hemoglobin A1C: 5.6 % (ref 4.0–5.6)

## 2020-05-20 MED ORDER — PAROXETINE HCL 20 MG PO TABS: 20.0000 mg | ORAL_TABLET | Freq: Every day | ORAL | 3 refills | Status: DC

## 2020-05-20 MED ORDER — CLONAZEPAM 0.5 MG PO TABS: 0.5000 mg | ORAL_TABLET | Freq: Two times a day (BID) | ORAL | 5 refills | Status: DC

## 2020-05-20 MED ORDER — FLUTICASONE-SALMETEROL 100-50 MCG/ACT IN AEPB: INHALATION_SPRAY | RESPIRATORY_TRACT | 1 refills | Status: DC

## 2020-05-20 NOTE — Progress Notes (Addendum)
Subjective:    Patient ID: Paula Tucker, female    DOB: 1975-12-08, 45 y.o.   MRN: [id number]  HPI  Pt is a 45 yo female with anxiety, migraines, hypertension who presents to the clinic for medication refill. She is doing well overall. In the last couple months, she has found diet and exercise harder to incorporate into her routine. She denies any polydipsia, polyuria, headache, or feelings of hypoglycemia.   She has been under a good amount of stress lately with work, but takes her Paxil and half a Klonopin in the morning and a whole pill before bed. She feels like her medication keeps her anxiety well controlled.  .. Active Ambulatory Problems    Diagnosis Date Noted  . Anxiety and depression 06/14/2011  . Asthma 06/14/2011  . Hypertension 07/29/2011  . Migraine 03/04/2012  . Skin nodule 03/10/2015  . 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 Abnormal otoscopic exam of left ear 02/04/2016  . Sinusitis with nasal polyps 04/14/2016  . Morbid obesity (HCC) 09/19/2016  . Anxiety 10/05/2017  . Acute dysfunction of Eustachian tube, left 10/14/2017  . Vertigo 10/18/2017  . Benign breast cyst in female, left 10/18/2018  . Pre-diabetes 11/27/2019   Resolved Ambulatory Problems    Diagnosis Date Noted  . Ear pressure, left 02/04/2016   Past Medical History:  Diagnosis Date  . Asthma        Review of Systems  All other systems reviewed and are negative.      Objective:   Physical Exam Vitals reviewed.  Constitutional:      Appearance: Normal appearance. She is obese.  Cardiovascular:     Rate and Rhythm: Normal rate and regular rhythm.     Pulses: Normal pulses.     Heart sounds: Normal heart sounds.  Pulmonary:     Effort: Pulmonary effort is normal.     Breath sounds: Normal breath sounds.  Neurological:     General: No focal deficit present.     Mental Status: She is alert and oriented to person, place, and time.  Psychiatric:        Mood and Affect: Mood normal.       .. GAD 7 : Generalized Anxiety Score 05/20/2020 11/02/2019 05/04/2019 11/03/2018  Nervous, Anxious, on Edge 1 1 2 1   Control/stop worrying 0 1 1 1   Worry too much - different things 1 1 1 1   Trouble relaxing 1 1 1 1   Restless 0 0 0 0  Easily annoyed or irritable 1 1 1 1   Afraid - awful might happen 0 1 0 1  Total GAD 7 Score 4 6 6 6   Anxiety Difficulty Not difficult at all Not difficult at all Not difficult at all Not difficult at all    . Depression screen [facility] 2/9 05/20/2020 11/02/2019 05/04/2019 11/03/2018 05/02/2018  Decreased Interest 1 1 1 1  0  Down, Depressed, Hopeless 1 1 0 1 0  PHQ - 2 Score 2 2 1 2  0  Altered sleeping 1 0 0 1 0  Tired, decreased energy 2 2 1 1  0  Change in appetite 0 0 0 0 0  Feeling bad or failure about yourself  0 0 0 1 0  Trouble concentrating 0 1 1 1  0  Moving slowly or fidgety/restless 0 0 0 0 0  Suicidal thoughts 0 0 0 0 0  PHQ-9 Score 5 5 3 6  0  Difficult doing work/chores Not difficult at all Not difficult at all Not  difficult at all Not difficult at all Not difficult at all        Assessment & Plan:  .Marland KitchenLyna was seen today for follow-up.  Diagnoses and all orders for this visit:  Anxiety and depression -     clonazePAM (KLONOPIN) 0.5 MG tablet; Take 1 tablet (0.5 mg total) by mouth 2 (two) times daily. -     PARoxetine (PAXIL) 20 MG tablet; Take 1 tablet (20 mg total) by mouth daily.  Pre-diabetes -     POCT glycosylated hemoglobin (Hb A1C)  Mild intermittent asthma without complication -     fluticasone-salmeterol (WIXELA INHUB) 100-50 MCG/ACT AEPB; INHALE 1 PUFF BY MOUTH TWICE A DAY  Stable GAD. Refilled Klonopin and Paxil.  Encouraged patient to get back into regular routine of exercise and diet A1C is 5.6 and improving.  Labs UTD.  Discussed getting 2nd covid booster being high risk with asthma and her frequent travel.   Marland KitchenHarlon Flor PA-C, have reviewed and agree with the above documentation in it's entirety.

## 2020-05-21 ENCOUNTER — Encounter: Payer: Self-pay | Admitting: Physician Assistant

## 2020-06-05 ENCOUNTER — Encounter: Payer: Self-pay | Admitting: Family Medicine

## 2020-06-05 ENCOUNTER — Other Ambulatory Visit: Payer: Self-pay

## 2020-06-05 ENCOUNTER — Ambulatory Visit: Payer: BC Managed Care – PPO | Admitting: Family Medicine

## 2020-06-05 VITALS — BP 128/80 | HR 77 | Ht 60.0 in | Wt 217.1 lb

## 2020-06-05 DIAGNOSIS — R5383 Other fatigue: Secondary | ICD-10-CM

## 2020-06-05 DIAGNOSIS — R197 Diarrhea, unspecified: Secondary | ICD-10-CM

## 2020-06-05 NOTE — Progress Notes (Signed)
Acute Office Visit  Subjective:    Patient ID: Paula Tucker, female    DOB: 1975-03-15, 45 y.o.   MRN: [id number]  Chief Complaint  Patient presents with  . Diarrhea    HPI Patient is in today for diarrhea.  Patient states that around Mother's Day weekend she started to notice some increased gassiness and occasional abdominal discomfort which she initially thought might be related to eating some Fiber One bars. Symptoms continued and BMs began increasing to 2-3x/day and eventually turning to diarrhea. Over the past week she has been having 5-6 episodes of diarrhea per day. She has been trying to eat bland foods like saltines and cheerios, but even those seem to go right through her. She denies any abdominal pain, but states she just has a generalized "rumbly" discomfort that comes and goes throughout the day. She has not noticed any trends with specific foods or time of the day. She has not been taking anything for the diarrhea. Has had some mild fatigue the past couple days, but nothing significant. She denies any nausea, vomiting, abdominal pain, body aches, blood in stool or urine, dysuria or urinary changes. No known triggers or stressors, mood changes. No unusual color/odor to stool or unintentional weight loss. No family history of GI problems.     Past Medical History:  Diagnosis Date  . Anxiety   . Asthma     Past Surgical History:  Procedure Laterality Date  . cyst on neck      Family History  Problem Relation Age of Onset  . Heart failure Mother   . Depression Father   . Cancer Maternal Aunt        Melanoma  . Depression Maternal Aunt   . Cancer Paternal Aunt        Breast Cancer  . Diabetes Maternal Grandmother   . Cancer Maternal Grandfather        Melanoma  . Diabetes Maternal Grandfather   . Stroke Maternal Grandfather     Social History   Socioeconomic History  . Marital status: Married    Spouse name: Not on file  . Number of children: Not on file   . Years of education: Not on file  . Highest education level: Not on file  Occupational History  . Not on file  Tobacco Use  . Smoking status: Never Smoker  . Smokeless tobacco: Never Used  Substance and Sexual Activity  . Alcohol use: No  . Drug use: No  . Sexual activity: Yes    Birth control/protection: Pill  Other Topics Concern  . Not on file  Social History Narrative  . Not on file   Social Determinants of Health   Financial Resource Strain: Not on file  Food Insecurity: Not on file  Transportation Needs: Not on file  Physical Activity: Not on file  Stress: Not on file  Social Connections: Not on file  Intimate Partner Violence: Not on file    Outpatient Medications Prior to Visit  Medication Sig Dispense Refill  . BUDESONIDE NA ADD OF MEDICATION TO OF SALINE IN SALINE IRRIGATION BOTTLE; IRRIGATE SINUSES WITH THROUGH EACH NOSTRIL TWICE DAILY    . clonazePAM (KLONOPIN) 0.5 MG tablet Take 1 tablet (0.5 mg total) by mouth 2 (two) times daily. 60 tablet 5  . fluticasone-salmeterol (WIXELA INHUB) 100-50 MCG/ACT AEPB INHALE 1 PUFF BY MOUTH TWICE A DAY 180 each 1  . PARoxetine (PAXIL) 20 MG tablet Take 1 tablet (20 mg total)  by mouth daily. 90 tablet 3   No facility-administered medications prior to visit.    No Known Allergies  Review of Systems All review of systems negative except what is listed in the HPI     Objective:    Physical Exam Constitutional:      Appearance: Normal appearance.  Cardiovascular:     Rate and Rhythm: Regular rhythm.     Heart sounds: Normal heart sounds.  Pulmonary:     Breath sounds: Normal breath sounds.  Abdominal:     General: Bowel sounds are normal. There is no distension.     Palpations: Abdomen is soft.     Tenderness: There is no right CVA tenderness, left CVA tenderness or guarding.     Comments: Mild discomfort to right and left lower quadrant palpation, no pain/guarding   Skin:    General: Skin is  warm and dry.     Findings: No rash.  Neurological:     General: No focal deficit present.     Mental Status: She is alert and oriented to person, place, and time.  Psychiatric:        Mood and Affect: Mood normal.        Behavior: Behavior normal.        Thought Content: Thought content normal.        Judgment: Judgment normal.     BP 128/80   Pulse 77   Ht 5' (1.524 m)   Wt 217 lb 1.6 oz (98.5 kg)   SpO2 99%   BMI 42.40 kg/m  Wt Readings from Last 3 Encounters:  06/05/20 217 lb 1.6 oz (98.5 kg)  05/20/20 217 lb (98.4 kg)  11/02/19 223 lb (101.2 kg)    Health Maintenance Due  Topic Date Due  . HIV Screening  Never done    There are no preventive care reminders to display for this patient.   Lab Results  Component Value Date   TSH 1.81 11/23/2019   Lab Results  Component Value Date   WBC 7.7 11/23/2019   HGB 12.7 11/23/2019   HCT 38.3 11/23/2019   MCV 84.9 11/23/2019   PLT 340 11/23/2019   Lab Results  Component Value Date   NA 138 11/23/2019   K 4.3 11/23/2019   CO2 24 11/23/2019   GLUCOSE 100 (H) 11/23/2019   BUN 9 11/23/2019   CREATININE 0.54 11/23/2019   BILITOT 0.3 11/23/2019   ALKPHOS 71 08/29/2014   AST 18 11/23/2019   ALT 18 11/23/2019   PROT 6.6 11/23/2019   ALBUMIN 3.9 08/29/2014   CALCIUM 9.4 11/23/2019   Lab Results  Component Value Date   CHOL 182 11/23/2019   Lab Results  Component Value Date   HDL 52 11/23/2019   Lab Results  Component Value Date   LDLCALC 111 (H) 11/23/2019   Lab Results  Component Value Date   TRIG 93 11/23/2019   Lab Results  Component Value Date   CHOLHDL 3.5 11/23/2019   Lab Results  Component Value Date   HGBA1C 5.6 05/20/2020       Assessment & Plan:   1. Diarrhea, unspecified type 2. Fatigue, unspecified type About 2.5 weeks of GI discomfort/diarrhea. No alarm findings today. Doesn't sound infectious, given she has no other symptoms, but will get stool cultures and labs to rule out  differentials. Patient aware of signs/symptoms requiring further/urgent evaluation. She can try occasional imodium to see if that helps at all. Follow-up after results are in -  next steps could include imaging and/or GI referral.   - CBC with Differential - COMPLETE METABOLIC PANEL WITH GFR - TSH - Salmonella/Shigella Cult, Campy EIA and Shiga Toxin reflex - C. difficile GDH and Toxin A/B - Celiac Pnl 2 rflx Endomysial Ab Ttr - Lipase  Follow-up pending test results or sooner if needed.    Clayborne Dana, NP

## 2020-06-05 NOTE — Patient Instructions (Signed)
Diarrhea, Adult Diarrhea is frequent loose and watery bowel movements. Diarrhea can make you feel weak and cause you to become dehydrated. Dehydration can make you tired and thirsty, cause you to have a dry mouth, and decrease how often you urinate. Diarrhea typically lasts 2-3 days. However, it can last longer if it is a sign of something more serious. It is important to treat your diarrhea as told by your health care provider. Follow these instructions at home: Eating and drinking Follow these recommendations as told by your health care provider:  Take an oral rehydration solution (ORS). This is an over-the-counter medicine that helps return your body to its normal balance of nutrients and water. It is found at pharmacies and retail stores.  Drink plenty of fluids, such as water, ice chips, diluted fruit juice, and low-calorie sports drinks. You can drink milk also, if desired.  Avoid drinking fluids that contain a lot of sugar or caffeine, such as energy drinks, sports drinks, and soda.  Eat bland, easy-to-digest foods in small amounts as you are able. These foods include bananas, applesauce, rice, lean meats, toast, and crackers.  Avoid alcohol.  Avoid spicy or fatty foods.      Medicines  Take over-the-counter and prescription medicines only as told by your health care provider.  If you were prescribed an antibiotic medicine, take it as told by your health care provider. Do not stop using the antibiotic even if you start to feel better. General instructions  Wash your hands often using soap and water. If soap and water are not available, use a hand sanitizer. Others in the household should wash their hands as well. Hands should be washed: ? After using the toilet or changing a diaper. ? Before preparing, cooking, or serving food. ? While caring for a sick person or while visiting someone in a hospital.  Drink enough fluid to keep your urine pale yellow.  Rest at home while you  recover.  Watch your condition for any changes.  Take a warm bath to relieve any burning or pain from frequent diarrhea episodes.  Keep all follow-up visits as told by your health care provider. This is important.   Contact a health care provider if:  You have a fever.  Your diarrhea gets worse.  You have new symptoms.  You cannot keep fluids down.  You feel light-headed or dizzy.  You have a headache.  You have muscle cramps. Get help right away if:  You have chest pain.  You feel extremely weak or you faint.  You have bloody or black stools or stools that look like tar.  You have severe pain, cramping, or bloating in your abdomen.  You have trouble breathing or you are breathing very quickly.  Your heart is beating very quickly.  Your skin feels cold and clammy.  You feel confused.  You have signs of dehydration, such as: ? Dark urine, very little urine, or no urine. ? Cracked lips. ? Dry mouth. ? Sunken eyes. ? Sleepiness. ? Weakness. Summary  Diarrhea is frequent loose and watery bowel movements. Diarrhea can make you feel weak and cause you to become dehydrated.  Drink enough fluids to keep your urine pale yellow.  Make sure that you wash your hands after using the toilet. If soap and water are not available, use hand sanitizer.  Contact a health care provider if your diarrhea gets worse or you have new symptoms.  Get help right away if you have signs of dehydration.   This information is not intended to replace advice given to you by your health care provider. Make sure you discuss any questions you have with your health care provider. Document Revised: 05/16/2018 Document Reviewed: 06/03/2017 Elsevier Patient Education  2021 [employer] Inc.  

## 2020-06-06 NOTE — Progress Notes (Signed)
MyChart message sent  - CBC, CMP, TSH, and lipase were all normal! We will see what the stool samples say.

## 2020-06-11 LAB — SALMONELLA/SHIGELLA CULT, CAMPY EIA AND SHIGA TOXIN RFL ECOLI
MICRO NUMBER: 11946974
MICRO NUMBER:: 11946975
MICRO NUMBER:: 11946976
Result:: NOT DETECTED
SHIGA RESULT:: NOT DETECTED
SPECIMEN QUALITY: ADEQUATE
SPECIMEN QUALITY:: ADEQUATE
SPECIMEN QUALITY:: ADEQUATE

## 2020-06-11 LAB — C. DIFFICILE GDH AND TOXIN A/B
GDH ANTIGEN: NOT DETECTED
MICRO NUMBER:: 11945842
SPECIMEN QUALITY:: ADEQUATE
TOXIN A AND B: NOT DETECTED

## 2020-06-12 LAB — COMPLETE METABOLIC PANEL WITH GFR
AG Ratio: 1.4 (calc) (ref 1.0–2.5)
ALT: 16 U/L (ref 6–29)
AST: 15 U/L (ref 10–30)
Albumin: 4 g/dL (ref 3.6–5.1)
Alkaline phosphatase (APISO): 50 U/L (ref 31–125)
BUN: 10 mg/dL (ref 7–25)
CO2: 25 mmol/L (ref 20–32)
Calcium: 9.1 mg/dL (ref 8.6–10.2)
Chloride: 105 mmol/L (ref 98–110)
Creat: 0.55 mg/dL (ref 0.50–1.10)
GFR, Est African American: 134 mL/min/{1.73_m2} (ref >=60)
GFR, Est Non African American: 116 mL/min/{1.73_m2} (ref >=60)
Globulin: 2.9 g/dL (calc) (ref 1.9–3.7)
Glucose, Bld: 83 mg/dL (ref 65–99)
Potassium: 4 mmol/L (ref 3.5–5.3)
Sodium: 141 mmol/L (ref 135–146)
Total Bilirubin: 0.4 mg/dL (ref 0.2–1.2)
Total Protein: 6.9 g/dL (ref 6.1–8.1)

## 2020-06-12 LAB — CELIAC PNL 2 RFLX ENDOMYSIAL AB TTR
(tTG) Ab, IgA: <1 U/mL
(tTG) Ab, IgG: <1 U/mL
Endomysial Ab IgA: NEGATIVE
Gliadin IgA: <1 U/mL
Gliadin IgG: <1 U/mL
Immunoglobulin A: 267 mg/dL (ref 47–310)

## 2020-06-12 LAB — CBC WITH DIFFERENTIAL/PLATELET
Absolute Monocytes: 567 cells/uL (ref 200–950)
Basophils Absolute: 63 cells/uL (ref 0–200)
Basophils Relative: 0.7 %
Eosinophils Absolute: 261 cells/uL (ref 15–500)
Eosinophils Relative: 2.9 %
HCT: 37.6 % (ref 35.0–45.0)
Hemoglobin: 12.3 g/dL (ref 11.7–15.5)
Lymphs Abs: 2421 cells/uL (ref 850–3900)
MCH: 28.5 pg (ref 27.0–33.0)
MCHC: 32.7 g/dL (ref 32.0–36.0)
MCV: 87 fL (ref 80.0–100.0)
MPV: 10.4 fL (ref 7.5–12.5)
Monocytes Relative: 6.3 %
Neutro Abs: 5688 cells/uL (ref 1500–7800)
Neutrophils Relative %: 63.2 %
Platelets: 317 10*3/uL (ref 140–400)
RBC: 4.32 10*6/uL (ref 3.80–5.10)
RDW: 12.9 % (ref 11.0–15.0)
Total Lymphocyte: 26.9 %
WBC: 9 10*3/uL (ref 3.8–10.8)

## 2020-06-12 LAB — LIPASE: Lipase: 54 U/L (ref 7–60)

## 2020-06-12 LAB — TSH: TSH: 1.75 mIU/L

## 2020-06-14 NOTE — Progress Notes (Signed)
MyChart message sent: Stool samples were negative. Are you still having symptoms? If so, we can refer to GI. Let me know! I hope you are feeling better!

## 2020-08-04 ENCOUNTER — Encounter: Payer: Self-pay | Admitting: Physician Assistant

## 2020-08-05 ENCOUNTER — Encounter: Payer: Self-pay | Admitting: Family Medicine

## 2020-08-05 ENCOUNTER — Telehealth (INDEPENDENT_AMBULATORY_CARE_PROVIDER_SITE_OTHER): Payer: BC Managed Care – PPO | Admitting: Family Medicine

## 2020-08-05 DIAGNOSIS — U071 COVID-19: Secondary | ICD-10-CM

## 2020-08-05 DIAGNOSIS — J4520 Mild intermittent asthma, uncomplicated: Secondary | ICD-10-CM

## 2020-08-05 MED ORDER — MOLNUPIRAVIR EUA 200MG CAPSULE: 4.0000 | ORAL_CAPSULE | Freq: Two times a day (BID) | ORAL | 0 refills | Status: AC

## 2020-08-05 MED ORDER — ALBUTEROL SULFATE HFA 108 (90 BASE) MCG/ACT IN AERS: 2.0000 | INHALATION_SPRAY | Freq: Four times a day (QID) | RESPIRATORY_TRACT | 11 refills | Status: DC | PRN

## 2020-08-05 NOTE — Patient Instructions (Signed)
Molnupiravir patient fact sheet: https://www.fda.gov/media/155055/download  Over the counter medications that may be helpful for symptoms:  Guaifenesin 1200 mg extended release tabs twice daily, with plenty of water For cough and congestion Brand name: Mucinex   Pseudoephedrine 30 mg, one or two tabs every 4 to 6 hours For sinus congestion Brand name: Sudafed You must get this from the pharmacy counter.  Oxymetazoline nasal spray each morning, one spray in each nostril, for NO MORE THAN 3 days  For nasal and sinus congestion Brand name: Afrin Saline nasal spray or Saline Nasal Irrigation 3-5 times a day For nasal and sinus congestion Brand names: Ocean or AYR Fluticasone nasal spray, one spray in each nostril, each morning (after oxymetazoline and saline, if used) For nasal and sinus congestion Brand name: Flonase Warm salt water gargles  For sore throat Every few hours as needed Alternate ibuprofen 400-600 mg and acetaminophen 1000 mg every 4-6 hours For fever, body aches, headache Brand names: Motrin or Advil and Tylenol Dextromethorphan 12-hour cough version 30 mg every 12 hours  For cough Brand name: Delsym Stop all other cold medications for now (Nyquil, Dayquil, Tylenol Cold, Theraflu, etc) and other non-prescription cough/cold preparations. Many of these have the same ingredients listed above and could cause an overdose of medication.   Herbal treatments that have been shown to be helpful in some patients include: Vitamin C 1000mg per day Vitamin D 4000iU per day Zinc 100mg per day Quercetin 25-500mg twice a day Melatonin 5-10mg at bedtime  General Instructions Allow your body to rest Drink PLENTY of fluids Isolate yourself from everyone, even family, until test results have returned  If your COVID-19 test is positive Then you ARE INFECTED and you can pass the virus to others You must quarantine from others for a minimum of  10 days since symptoms started  AND You are fever free for 24 hours WITHOUT any medication to reduce fever AND Your symptoms are improving Do not go to the store or other public areas Do not go around household members who are not known to be infected with COVID-19 If you MUST leave your area of quarantine (example: go to a bathroom you share with others in your home), you must Wear a mask Wash your hands thoroughly Wipe down any surfaces you touch Do not share food, drinks, towels, or other items with other persons Dispose of your own tissues, food containers, etc  Once you have recovered, please continue good preventive care measures, including:  wearing a mask when in public wash your hands frequently avoid touching your face/nose/eyes cover coughs/sneezes with the inside of your elbow stay out of crowds keep a 6 foot distance from others  If you develop severe shortness of breath, uncontrolled fevers, coughing up blood, confusion, chest pain, or signs of dehydration (such as significantly decreased urine amounts or dizziness with standing) please go to the ER.  

## 2020-08-05 NOTE — Progress Notes (Signed)
Virtual Video Visit via MyChart Note  I connected with  Paula Tucker on 08/05/20 at  3:00 PM EDT by the video enabled telemedicine application for MyChart, and verified that I am speaking with the correct person using two identifiers.   I introduced myself as a Publishing rights manager with the practice. We discussed the limitations of evaluation and management by telemedicine and the availability of in person appointments. The patient expressed understanding and agreed to proceed.  Participating parties in this visit include: The patient and the nurse practitioner listed.  The patient is: At home I am: In the office - [facility]  Subjective:    CC:  Chief Complaint  Patient presents with   Covid Positive    HPI: Paula Tucker is a 45 y.o. year old female presenting today via MyChart today for positive COVID test.  08/01/20 started to get a little nasal congestion and sore throat, but worst symptoms started Saturday night. Positive home COVID test on Sunday 08/03/20 Currently feeling a little congested and achy; no significant cough, chest pain, loss of taste/smell, GI/GU, dehydration She does have some dyspnea on exertion and mild fatigue  O2 sats staying mid-90s. No fevers  Fully vaccinated     Past medical history, Surgical history, Family history not pertinant except as noted below, Social history, Allergies, and medications have been entered into the medical record, reviewed, and corrections made.   Review of Systems:  All review of systems negative except what is listed in the HPI   Objective:    General:  Speaking clearly in complete sentences. Absent shortness of breath noted.   Alert and oriented x3.   Normal judgment.  Absent acute distress.   Impression and Recommendations:    1. COVID-19 - molnupiravir EUA 200 mg CAPS; Take 4 capsules (800 mg total) by mouth 2 (two) times daily for 5 days.  Dispense: 40 capsule; Refill: 0  2. Mild intermittent  asthma without complication - albuterol (VENTOLIN HFA) 108 (90 Base) MCG/ACT inhaler; Inhale 2 puffs into the lungs every 6 (six) hours as needed for wheezing.  Dispense: 2 each; Refill: 11  Currently on day 5 of symptoms. She does have asthma and would like to take antiviral. We discussed options, risks vs benefits, and I am ordering molnupiravir. Attaching like to AVS with patient fact sheet. Continue supportive measures: rest, hydration, humidifier use, warm compresses, OTC cough/cold/analgesics, Vitamin C, zinc, etc. Patient aware of signs/symptoms requiring further/urgent evaluation.   Follow-up if symptoms worsen or fail to improve.    I discussed the assessment and treatment plan with the patient. The patient was provided an opportunity to ask questions and all were answered. The patient agreed with the plan and demonstrated an understanding of the instructions.   The patient was advised to call back or seek an in-person evaluation if the symptoms worsen or if the condition fails to improve as anticipated.  I spent 20 minutes dedicated to the care of this patient on the date of this encounter to include pre-visit chart review of prior notes and results, face-to-face time with the patient, and post-visit ordering of testing as indicated.   Lollie Marrow Reola Calkins, DNP, FNP-C

## 2020-11-11 ENCOUNTER — Other Ambulatory Visit: Payer: Self-pay

## 2020-11-11 ENCOUNTER — Ambulatory Visit: Payer: BC Managed Care – PPO | Admitting: Family Medicine

## 2020-11-11 ENCOUNTER — Encounter: Payer: Self-pay | Admitting: Family Medicine

## 2020-11-11 ENCOUNTER — Other Ambulatory Visit: Payer: Self-pay | Admitting: Physician Assistant

## 2020-11-11 VITALS — BP 138/86 | HR 91 | Temp 99.0°F | Wt 218.1 lb

## 2020-11-11 DIAGNOSIS — J3489 Other specified disorders of nose and nasal sinuses: Secondary | ICD-10-CM | POA: Diagnosis not present

## 2020-11-11 DIAGNOSIS — J101 Influenza due to other identified influenza virus with other respiratory manifestations: Secondary | ICD-10-CM | POA: Diagnosis not present

## 2020-11-11 DIAGNOSIS — Z1231 Encounter for screening mammogram for malignant neoplasm of breast: Secondary | ICD-10-CM

## 2020-11-11 LAB — POCT INFLUENZA A/B
Influenza A, POC: POSITIVE — AB
Influenza B, POC: NEGATIVE

## 2020-11-11 MED ORDER — PREDNISONE 20 MG PO TABS: 20.0000 mg | ORAL_TABLET | Freq: Every day | ORAL | 0 refills | Status: AC

## 2020-11-11 NOTE — Progress Notes (Signed)
Acute Office Visit  Subjective:    Patient ID: Paula Tucker, female    DOB: 03/21/1975, 45 y.o.   MRN: [id number]  Chief Complaint  Patient presents with   Sinusitis     Sinusitis  Patient is in today for URI symptoms.  Hend has been having increasing symptoms since last week that got significantly worse on Saturday. States her cough has become productive with yellow/green sputum, increasing maxillary sinus pressure, headaches, mild malaise. She denies any fever, body aches, GI/GU symptoms, chest pain, shortness of breath, wheezing.   Reports history of recurrent sinus infections for which she had sinus surgery around 2018 with removal of polyps and some bone fragments. States she has not had any trouble since then until she had COVID over the summer. She has noticed some mild symptoms every since then, but this current episode or significant worsening started about 6-7 days ago. She has saline rinses compounding with Budesonide that she does regularly (from her ENT). She has routine follow-up with ENT in the next month or so.   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 She had a negative home COVID test this week.      Past Medical History:  Diagnosis Date   Anxiety    Asthma     Past Surgical History:  Procedure Laterality Date   cyst on neck      Family History  Problem Relation Age of Onset   Heart failure Mother    Depression Father    Cancer Maternal Aunt        Melanoma   Depression Maternal Aunt    Cancer Paternal Aunt        Breast Cancer   Diabetes Maternal Grandmother    Cancer Maternal Grandfather        Melanoma   Diabetes Maternal Grandfather    Stroke Maternal Grandfather     Social History   Socioeconomic History   Marital status: Married    Spouse name: Not on file   Number of children: Not on file   Years of education: Not on file   Highest education level: Not on file  Occupational History   Not on file   Tobacco Use   Smoking status: Never   Smokeless tobacco: Never  Substance and Sexual Activity   Alcohol use: No   Drug use: No   Sexual activity: Yes    Birth control/protection: Pill  Other Topics Concern   Not on file  Social History Narrative   Not on file   Social Determinants of Health   Financial Resource Strain: Not on file  Food Insecurity: Not on file  Transportation Needs: Not on file  Physical Activity: Not on file  Stress: Not on file  Social Connections: Not on file  Intimate Partner Violence: Not on file    Outpatient Medications Prior to Visit  Medication Sig Dispense Refill   albuterol (VENTOLIN HFA) 108 (90 Base) MCG/ACT inhaler Inhale 2 puffs into the lungs every 6 (six) hours as needed for wheezing. 2 each 11   BUDESONIDE NA ADD OF MEDICATION TO OF SALINE IN SALINE IRRIGATION BOTTLE; IRRIGATE SINUSES WITH THROUGH EACH NOSTRIL TWICE DAILY     clonazePAM (KLONOPIN) 0.5 MG tablet Take 1 tablet (0.5 mg total) by mouth 2 (two) times daily. 60 tablet 5   fluticasone-salmeterol (WIXELA INHUB) 100-50 MCG/ACT AEPB INHALE 1 PUFF BY MOUTH TWICE A DAY 180 each 1   PARoxetine (  PAXIL) 20 MG tablet Take 1 tablet (20 mg total) by mouth daily. 90 tablet 3   No facility-administered medications prior to visit.    No Known Allergies  Review of Systems All review of systems negative except what is listed in the HPI     Objective:    Physical Exam Vitals reviewed.  Constitutional:      Appearance: Normal appearance.  HENT:     Head: Atraumatic.     Comments: Mild maxillary sinus tenderness on palpation    Right Ear: Tympanic membrane normal.     Left Ear: Tympanic membrane normal.     Nose: Nose normal.     Mouth/Throat:     Mouth: Mucous membranes are moist.     Pharynx: Oropharynx is clear. No oropharyngeal exudate or posterior oropharyngeal erythema.  Eyes:     Extraocular Movements: Extraocular movements intact.     Conjunctiva/sclera:  Conjunctivae normal.  Cardiovascular:     Rate and Rhythm: Normal rate and regular rhythm.     Heart sounds: Normal heart sounds.  Pulmonary:     Breath sounds: Normal breath sounds.  Musculoskeletal:     Cervical back: Normal range of motion and neck supple. No tenderness.  Lymphadenopathy:     Cervical: No cervical adenopathy.  Skin:    General: Skin is warm and dry.     Findings: No rash.  Neurological:     General: No focal deficit present.     Mental Status: She is alert and oriented to person, place, and time. Mental status is at baseline.  Psychiatric:        Mood and Affect: Mood normal.        Behavior: Behavior normal.        Thought Content: Thought content normal.        Judgment: Judgment normal.    BP 138/86 (BP Location: Left Arm, Patient Position: Sitting, Cuff Size: Large)   Pulse 91   Temp 99 F (37.2 C) (Oral)   Wt 218 lb 1.3 oz (98.9 kg)   SpO2 99%   BMI 42.59 kg/m  Wt Readings from Last 3 Encounters:  11/11/20 218 lb 1.3 oz (98.9 kg)  06/05/20 217 lb 1.6 oz (98.5 kg)  05/20/20 217 lb (98.4 kg)    Health Maintenance Due  Topic Date Due   HIV Screening  Never done   Hepatitis C Screening  Never done    There are no preventive care reminders to display for this patient.   Lab Results  Component Value Date   TSH 1.75 06/05/2020   Lab Results  Component Value Date   WBC 9.0 06/05/2020   HGB 12.3 06/05/2020   HCT 37.6 06/05/2020   MCV 87.0 06/05/2020   PLT 317 06/05/2020   Lab Results  Component Value Date   NA 141 06/05/2020   K 4.0 06/05/2020   CO2 25 06/05/2020   GLUCOSE 83 06/05/2020   BUN 10 06/05/2020   CREATININE 0.55 06/05/2020   BILITOT 0.4 06/05/2020   ALKPHOS 71 08/29/2014   AST 15 06/05/2020   ALT 16 06/05/2020   PROT 6.9 06/05/2020   ALBUMIN 3.9 08/29/2014   CALCIUM 9.1 06/05/2020   Lab Results  Component Value Date   CHOL 182 11/23/2019   Lab Results  Component Value Date   HDL 52 11/23/2019   Lab Results   Component Value Date   LDLCALC 111 (H) 11/23/2019   Lab Results  Component Value Date   TRIG  93 11/23/2019   Lab Results  Component Value Date   CHOLHDL 3.5 11/23/2019   Lab Results  Component Value Date   HGBA1C 5.6 05/20/2020       Assessment & Plan:   1. Sinus drainage 2. Influenza A Flu A positive. Past recommended window for Tamiflu. Will treat conservatively with OTC cough/cold/analgesics, rest, hydration, humidifier, nasal rinses, warm liquids. Given her sinus pressure and history, will give low dose prednisone. Instructed her to follow-up on Monday if not significantly better or sooner if symptoms worsen before then. Patient aware of signs/symptoms requiring further/urgent evaluation.  - POCT Influenza A/B - predniSONE (DELTASONE) 20 MG tablet; Take 1 tablet (20 mg total) by mouth daily with breakfast for 5 days.  Dispense: 5 tablet; Refill: 0  Follow-up as needed.   Lollie Marrow Reola Calkins, DNP, FNP-C

## 2020-11-11 NOTE — Patient Instructions (Signed)
Return to work if fever free for 24 hours with meds and symptoms improving.  Low dose prednisone to help you sinus inflammation  If not feeling significantly better by Monday, let us know and we will treat as sinus infection with antibiotics given your history  Over the counter medications that may be helpful for symptoms:  Guaifenesin 1200 mg extended release tabs twice daily, with plenty of water For cough and congestion Brand name: Mucinex   Pseudoephedrine 30 mg, one or two tabs every 4 to 6 hours For sinus congestion Brand name: Sudafed You must get this from the pharmacy counter.  Oxymetazoline nasal spray each morning, one spray in each nostril, for NO MORE THAN 3 days  For nasal and sinus congestion Brand name: Afrin Saline nasal spray or Saline Nasal Irrigation 3-5 times a day For nasal and sinus congestion Brand names: Ocean or AYR Fluticasone nasal spray, one spray in each nostril, each morning (after oxymetazoline and saline, if used) For nasal and sinus congestion Brand name: Flonase Warm salt water gargles  For sore throat Every few hours as needed Alternate ibuprofen 400-600 mg and acetaminophen 1000 mg every 4-6 hours For fever, body aches, headache Brand names: Motrin or Advil and Tylenol Dextromethorphan 12-hour cough version 30 mg every 12 hours  For cough Brand name: Delsym Stop all other cold medications for now (Nyquil, Dayquil, Tylenol Cold, Theraflu, etc) and other non-prescription cough/cold preparations. Many of these have the same ingredients listed above and could cause an overdose of medication.   Herbal treatments that have been shown to be helpful in some patients include: Vitamin C 1000mg  per day Vitamin D 4000iU per day Zinc 100mg  per day Quercetin 25-500mg  twice a day Melatonin 5-10mg  at bedtime  General Instructions Allow your body to rest Drink PLENTY of fluids Isolate yourself from everyone, even family, until test results have  returned   If you develop severe shortness of breath, uncontrolled fevers, coughing up blood, confusion, chest pain, or signs of dehydration (such as significantly decreased urine amounts or dizziness with standing) please go to the ER.

## 2020-11-24 ENCOUNTER — Ambulatory Visit: Payer: BC Managed Care – PPO | Admitting: Physician Assistant

## 2020-11-25 ENCOUNTER — Ambulatory Visit: Payer: BC Managed Care – PPO | Admitting: Physician Assistant

## 2020-12-03 ENCOUNTER — Other Ambulatory Visit: Payer: Self-pay

## 2020-12-03 ENCOUNTER — Ambulatory Visit: Payer: BC Managed Care – PPO | Admitting: Physician Assistant

## 2020-12-03 ENCOUNTER — Encounter: Payer: Self-pay | Admitting: Physician Assistant

## 2020-12-03 VITALS — BP 136/80 | HR 89 | Temp 98.4°F | Ht 60.0 in | Wt 220.0 lb

## 2020-12-03 DIAGNOSIS — R7303 Prediabetes: Secondary | ICD-10-CM | POA: Diagnosis not present

## 2020-12-03 DIAGNOSIS — F32A Depression, unspecified: Secondary | ICD-10-CM | POA: Diagnosis not present

## 2020-12-03 DIAGNOSIS — J4520 Mild intermittent asthma, uncomplicated: Secondary | ICD-10-CM

## 2020-12-03 DIAGNOSIS — F419 Anxiety disorder, unspecified: Secondary | ICD-10-CM

## 2020-12-03 LAB — POCT GLYCOSYLATED HEMOGLOBIN (HGB A1C): Hemoglobin A1C: 5.7 % — AB (ref 4.0–5.6)

## 2020-12-03 MED ORDER — FLUTICASONE-SALMETEROL 100-50 MCG/ACT IN AEPB: INHALATION_SPRAY | RESPIRATORY_TRACT | 1 refills | Status: DC

## 2020-12-03 MED ORDER — CLONAZEPAM 0.5 MG PO TABS: 0.5000 mg | ORAL_TABLET | Freq: Two times a day (BID) | ORAL | 5 refills | Status: DC

## 2020-12-03 NOTE — Progress Notes (Signed)
Subjective:    Patient ID: Vidhi Delellis, female    DOB: 08/30/1975, 45 y.o.   MRN: [id number]  HPI Pt is a 45 yo female with hypertension, migraines, asthma, anxiety who presents to the clinic for medication refills.  Her asthma is doing well on Wixela.  She has no concerns or complaints.  She rarely uses her albuterol.  She denies any chest pains, palpitations, headaches or vision changes.  Her migraines are well controlled.  She has about 1-2 migraines a month.  Her rescue is effective.  Her anxiety is well controlled on daily Klonopin.  She is also taking Prozac.  She denies any suicidal thoughts or homicidal idealizations.  She does have history of elevated fasting glucose.  She does want to keep an eye on progression to diabetes.  .. 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     Objective:   Physical Exam Vitals reviewed.  Constitutional:      Appearance: Normal appearance. She is obese.  HENT:     Head: Normocephalic.  Neck:     Vascular: No carotid bruit.  Cardiovascular:     Rate and Rhythm: Normal rate and regular rhythm.     Pulses: Normal pulses.     Heart sounds: Normal heart sounds.  Pulmonary:     Effort: Pulmonary effort is normal.     Breath sounds: Normal breath sounds.   Abdominal:     General: Bowel sounds are normal.     Palpations: Abdomen is soft.  Musculoskeletal:     Right lower leg: No edema.     Left lower leg: No edema.  Lymphadenopathy:     Cervical: No cervical adenopathy.  Neurological:     General: No focal deficit present.     Mental Status: She is alert and oriented to person, place, and time.  Psychiatric:        Mood and Affect: Mood normal.      .. Depression screen [facility] 2/9 12/03/2020 05/20/2020 11/02/2019 05/04/2019 11/03/2018  Decreased Interest 0 1 1 1 1   Down, Depressed, Hopeless 0 1 1 0 1  PHQ - 2 Score 0 2 2 1 2   Altered sleeping - 1 0 0 1  Tired, decreased energy - 2 2 1 1   Change in appetite - 0 0 0 0  Feeling bad or failure about yourself  - 0 0 0 1  Trouble concentrating - 0 1 1 1   Moving slowly or fidgety/restless - 0 0 0 0  Suicidal thoughts - 0 0 0 0  PHQ-9 Score - 5 5 3 6   Difficult doing work/chores - Not difficult at all Not difficult at all Not difficult at all Not difficult at all   . GAD  7 : Generalized Anxiety Score 05/20/2020 11/02/2019 05/04/2019 11/03/2018  Nervous, Anxious, on Edge 1 1 2 1   Control/stop worrying 0 1 1 1   Worry too much - different things 1 1 1 1   Trouble relaxing 1 1 1 1   Restless 0 0 0 0  Easily annoyed or irritable 1 1 1 1   Afraid - awful might happen 0 1 0 1  Total GAD 7 Score 4 6 6 6   Anxiety Difficulty Not difficult at all Not difficult at all Not difficult at all Not difficult at all     Results for orders placed or performed in visit on 12/03/20  POCT glycosylated hemoglobin (Hb A1C)  Result Value Ref Range   Hemoglobin A1C 5.7 (A) 4.0 - 5.6 %   HbA1c POC (<> result, manual entry)     HbA1c, POC (prediabetic range)     HbA1c, POC (controlled diabetic range)         Assessment & Plan:  . Alyia was seen today for depression, anxiety, prediabetes and asthma.  Diagnoses and all orders for this visit:  Anxiety and depression -     clonazePAM (KLONOPIN) 0.5 MG  tablet; Take 1 tablet (0.5 mg total) by mouth 2 (two) times daily.  Pre-diabetes -     POCT glycosylated hemoglobin (Hb A1C)  Mild intermittent asthma without complication -     fluticasone-salmeterol (WIXELA INHUB) 100-50 MCG/ACT AEPB; INHALE 1 PUFF BY MOUTH TWICE A DAY  A1c is 5.7 and continues in prediabetes range but has not worsened.  Continue to work on regular exercise and low sugar low-carb diet.  Asthma and allergy medication refilled. Klonopin refilled for 6 months.PHQ and GAd no concerns.   Patient declined flu and pneumonia shot today. Covid vaccines x4.   Follow up in 6 months.

## 2021-01-07 ENCOUNTER — Ambulatory Visit: Payer: BC Managed Care – PPO | Admitting: Physician Assistant

## 2021-01-07 ENCOUNTER — Ambulatory Visit (INDEPENDENT_AMBULATORY_CARE_PROVIDER_SITE_OTHER): Payer: BC Managed Care – PPO

## 2021-01-07 ENCOUNTER — Other Ambulatory Visit: Payer: Self-pay

## 2021-01-07 VITALS — BP 136/87 | HR 83 | Temp 98.4°F | Ht 60.0 in | Wt 216.0 lb

## 2021-01-07 DIAGNOSIS — Z1231 Encounter for screening mammogram for malignant neoplasm of breast: Secondary | ICD-10-CM

## 2021-01-07 DIAGNOSIS — J029 Acute pharyngitis, unspecified: Secondary | ICD-10-CM

## 2021-01-07 DIAGNOSIS — J329 Chronic sinusitis, unspecified: Secondary | ICD-10-CM

## 2021-01-07 MED ORDER — METHYLPREDNISOLONE 4 MG PO TBPK: ORAL_TABLET | ORAL | 0 refills | Status: DC

## 2021-01-07 MED ORDER — DOXYCYCLINE HYCLATE 100 MG PO TABS: 100.0000 mg | ORAL_TABLET | Freq: Two times a day (BID) | ORAL | 0 refills | Status: DC

## 2021-01-07 NOTE — Progress Notes (Signed)
° °  Subjective:    Patient ID: Paula Tucker, female    DOB: [birth year], 45 y.o.   MRN: [id number]  HPI Pt is a 45 yo obese female with asthma, allergies, recurrent sinusitis who presents to the clinic with sinus pressure, sinus drainage, ST, headache, congestion, cough. No fever, chills, body aches. She has been having symptoms for last 2 weeks. She treated with OtC medications like tylenol cold sinus severe. Hx of recurrent sinusitis after URI.   .. 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  Objective:   Physical Exam Vitals reviewed.  Constitutional:      Appearance: She is well-developed. She is obese.  HENT:     Head: Normocephalic.     Right Ear: Tympanic membrane normal.     Left Ear: Tympanic membrane normal.     Nose: Congestion and rhinorrhea present.     Mouth/Throat:     Mouth: Mucous membranes are moist.     Pharynx: Posterior oropharyngeal erythema present. No oropharyngeal exudate or uvula swelling.     Tonsils: No tonsillar exudate or tonsillar abscesses.  Eyes:     Conjunctiva/sclera: Conjunctivae normal.     Pupils: Pupils are equal, round, and reactive to light.  Cardiovascular:     Rate and Rhythm: Normal rate and regular rhythm.     Heart sounds: Normal heart sounds.  Pulmonary:     Effort: Pulmonary effort is  normal.     Breath sounds: Normal breath sounds.  Musculoskeletal:     Cervical back: Normal range of motion.  Neurological:     General: No focal deficit present.     Mental Status: She is alert.  Psychiatric:        Mood and Affect: Mood normal.   Bilateral maxillary sinus pressure to palpation        Assessment & Plan:  .Marland KitchenNaylene was seen today for sore throat.  Diagnoses and all orders for this visit:  Recurrent sinusitis -     doxycycline (VIBRA-TABS) 100 MG tablet; Take 1 tablet (100 mg total) by mouth 2 (two) times daily. -     methylPREDNISolone (MEDROL DOSEPAK) 4 MG TBPK tablet; Take as directed by package insert.  Sore throat   2 weeks of URI symptoms likely viral to secondary bacterial Doxy and medrol dose pack given Use flonase Continue allergy medications  Vitals and PE today reassuring Follow up as needed or if symptoms worsen

## 2021-01-07 NOTE — Patient Instructions (Signed)

## 2021-01-08 NOTE — Progress Notes (Signed)
Normal mammogram. Follow up in 1 year.

## 2021-03-13 ENCOUNTER — Other Ambulatory Visit: Payer: Self-pay

## 2021-03-13 ENCOUNTER — Ambulatory Visit: Payer: BC Managed Care – PPO | Admitting: Physician Assistant

## 2021-03-13 VITALS — Ht 60.0 in | Wt 217.0 lb

## 2021-03-13 DIAGNOSIS — R519 Headache, unspecified: Secondary | ICD-10-CM

## 2021-03-13 DIAGNOSIS — J3489 Other specified disorders of nose and nasal sinuses: Secondary | ICD-10-CM | POA: Diagnosis not present

## 2021-03-13 DIAGNOSIS — R42 Dizziness and giddiness: Secondary | ICD-10-CM | POA: Diagnosis not present

## 2021-03-13 DIAGNOSIS — J329 Chronic sinusitis, unspecified: Secondary | ICD-10-CM

## 2021-03-13 MED ORDER — METHYLPREDNISOLONE SODIUM SUCC 125 MG IJ SOLR
125.0000 mg | Freq: Once | INTRAMUSCULAR | Status: AC
  Administered 2021-03-13: 125 mg via INTRAMUSCULAR

## 2021-03-13 MED ORDER — SULFAMETHOXAZOLE-TRIMETHOPRIM 800-160 MG PO TABS: 1.0000 | ORAL_TABLET | Freq: Two times a day (BID) | ORAL | 0 refills | Status: DC

## 2021-03-13 MED ORDER — MECLIZINE HCL 25 MG PO TABS: 25.0000 mg | ORAL_TABLET | Freq: Three times a day (TID) | ORAL | 0 refills | Status: DC | PRN

## 2021-03-13 NOTE — Progress Notes (Signed)
? ?Subjective:  ? ? Patient ID: Paula Tucker, female    DOB: [birth year], 46 y.o.   MRN: [id number] ? ?Sinus Problem ?Associated symptoms include congestion and sinus pressure (Ethmoid > Frontal). Pertinent negatives include no ear pain or sore throat.  ?This is a 46 year old female with complicated sinus history presenting with improving sinus congestion/drainage but worsening vertigo. She states the vertigo has been worsening over the past month or so and is worse upon waking in the morning. She also has associated tinnitus. ? ?She states the sinus infection began to improve with the Doxycycline and Prednisone combo since being seen 01/07/21 which worsened again towards the end of January before getting worse again. Now the sinus symptoms have begun to improve again ? ?Worse when getting up in the Am. But pretty constant throughout the day as has the tinnitus (not worse in the AM). Primarily worse in the left side ? ?Sinus headaches and pain on back side of head left sided ? ?Had a bout of vertigo years ago and was given exercises to do unsure if helped ? ?Had [place] in July ?Around that time noticed with the saline rinse coming out of eyes and ears(?) mostly left but a couple of the times ? ?Gren yellow discharge out iwht nasal rinses BID with sinus headaches ? ?Has upcoming appointment with ENT 03/31/21 ? ?Had sinus surgery in 2018 (ethmoidectomy) ? ?Review of Systems  ?Constitutional: Negative.  Negative for fatigue and fever.  ?HENT:  Positive for congestion, rhinorrhea, sinus pressure (Ethmoid > Frontal) and tinnitus. Negative for ear pain and sore throat.   ?Respiratory: Negative.    ?Genitourinary: Negative.   ?Neurological:  Positive for dizziness.  ?     Ongoing vertigo  ? ?   ?Objective:  ? Physical Exam ?Vitals reviewed.  ?Constitutional:   ?   General: She is not in acute distress. ?   Appearance: Normal appearance. She is obese.  ?HENT:  ?   Head: Normocephalic and atraumatic.  ?   Right Ear: Tympanic  membrane normal. There is no impacted cerumen.  ?   Left Ear: Tympanic membrane normal. There is no impacted cerumen.  ?   Nose: Congestion present.  ?   Mouth/Throat:  ?   Mouth: Mucous membranes are moist.  ?   Pharynx: Oropharynx is clear. No oropharyngeal exudate.  ?Cardiovascular:  ?   Rate and Rhythm: Normal rate and regular rhythm.  ?   Pulses: Normal pulses.  ?   Heart sounds: No murmur heard. ?  No friction rub. No gallop.  ?Pulmonary:  ?   Effort: Pulmonary effort is normal.  ?   Breath sounds: Normal breath sounds. No wheezing.  ?Neurological:  ?   Mental Status: She is alert and oriented to person, place, and time.  ?   Comments: Negative [place]  ?Psychiatric:     ?   Mood and Affect: Mood normal.     ?   Behavior: Behavior normal.  ? ? ? ? ? ?   ?Assessment & Plan:  ? ?.Paula Tucker was seen today for sinus problem. ? ?Diagnoses and all orders for this visit: ? ?Vertigo ?-     meclizine (ANTIVERT) 25 MG tablet; Take 1 tablet (25 mg total) by mouth 3 (three) times daily as needed for dizziness. ?-     methylPREDNISolone sodium succinate (SOLU-MEDROL) 125 mg/2 mL injection 125 mg ? ?Sinus drainage ?-     methylPREDNISolone sodium succinate (SOLU-MEDROL) 125 mg/2 mL injection  125 mg ? ?Sinus headache ?-     methylPREDNISolone sodium succinate (SOLU-MEDROL) 125 mg/2 mL injection 125 mg ? ?Recurrent sinusitis ?-     sulfamethoxazole-trimethoprim (BACTRIM DS) 800-160 MG tablet; Take 1 tablet by mouth 2 (two) times daily. ? ? ?Start meclizine up to 3 times per day PRN for vertigo. ?Start Bactrim for chronic bacterial sinusitis. ?Solumedrol 125mg  given IM today to help with inflammation of sinuses. ?Follow up with ENT as scheduled 03/22/21 as scheduled or sooner with any new or worsening symptoms. ?Continue nasal steroid rinses.  ?

## 2021-03-16 ENCOUNTER — Encounter: Payer: Self-pay | Admitting: Physician Assistant

## 2021-06-02 ENCOUNTER — Ambulatory Visit: Payer: BC Managed Care – PPO | Admitting: Physician Assistant

## 2021-06-12 ENCOUNTER — Encounter: Payer: Self-pay | Admitting: Physician Assistant

## 2021-06-12 ENCOUNTER — Ambulatory Visit: Payer: BC Managed Care – PPO | Admitting: Physician Assistant

## 2021-06-12 VITALS — BP 129/71 | HR 79 | Ht 60.0 in | Wt 216.0 lb

## 2021-06-12 DIAGNOSIS — R7303 Prediabetes: Secondary | ICD-10-CM | POA: Diagnosis not present

## 2021-06-12 DIAGNOSIS — Z13220 Encounter for screening for lipoid disorders: Secondary | ICD-10-CM

## 2021-06-12 DIAGNOSIS — F419 Anxiety disorder, unspecified: Secondary | ICD-10-CM

## 2021-06-12 DIAGNOSIS — Z1329 Encounter for screening for other suspected endocrine disorder: Secondary | ICD-10-CM | POA: Diagnosis not present

## 2021-06-12 DIAGNOSIS — Z79899 Other long term (current) drug therapy: Secondary | ICD-10-CM | POA: Diagnosis not present

## 2021-06-12 DIAGNOSIS — F32A Depression, unspecified: Secondary | ICD-10-CM

## 2021-06-12 DIAGNOSIS — R42 Dizziness and giddiness: Secondary | ICD-10-CM

## 2021-06-12 DIAGNOSIS — H9312 Tinnitus, left ear: Secondary | ICD-10-CM

## 2021-06-12 DIAGNOSIS — J4520 Mild intermittent asthma, uncomplicated: Secondary | ICD-10-CM

## 2021-06-12 MED ORDER — PAROXETINE HCL 20 MG PO TABS: 20.0000 mg | ORAL_TABLET | Freq: Every day | ORAL | 1 refills | Status: DC

## 2021-06-12 MED ORDER — CLONAZEPAM 0.5 MG PO TABS: 0.5000 mg | ORAL_TABLET | Freq: Two times a day (BID) | ORAL | 5 refills | Status: DC

## 2021-06-12 MED ORDER — FLUTICASONE-SALMETEROL 100-50 MCG/ACT IN AEPB: INHALATION_SPRAY | RESPIRATORY_TRACT | 1 refills | Status: DC

## 2021-06-12 NOTE — Patient Instructions (Signed)
Will refer to neurology

## 2021-06-12 NOTE — Progress Notes (Signed)
Established Patient Office Visit  Subjective   Patient ID: Paula Tucker, female    DOB: 10-Aug-1975  Age: 46 y.o. MRN: [id number]  Chief Complaint  Patient presents with   Follow-up    HPI Pt is a 46 yo obese female with pre-diabetes, asthma, anxiety and depression, vertigo, ETD who presents to the clinic for medication refills.   Her mood is doing great. No concerns. Taking paxil and klonapin daily.   Asthma controlled. Rarely uses her rescue inhaler.   Her vertigo and ETD having problems with. She sees ENT. ENT worked up [employer] but now they do not think that is what it is. She continues to have vertigo worse in mornings and when sitting for long periods of time and then tries to get up. No hearing loss. She does having ringing in left ear. She does have headaches from time to time.    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/04/2016   Abnormal otoscopic exam of left ear 02/04/2016   Skin nodule 03/10/2015   Migraine 03/04/2012   Hypertension 07/29/2011   Anxiety and depression 06/14/2011   Asthma 06/14/2011   Past Medical History:  Diagnosis Date   Anxiety    Asthma    Family History  Problem Relation Age of Onset   Heart failure Mother    Depression Father    Cancer Maternal Aunt        Melanoma   Depression Maternal Aunt    Cancer Paternal Aunt        Breast Cancer   Diabetes Maternal Grandmother    Cancer Maternal Grandfather        Melanoma   Diabetes Maternal Grandfather    Stroke Maternal Grandfather    No Known Allergies   ROS:  See HPI.     Objective:     BP 129/71   Pulse 79   Ht 5' (1.524 m)   Wt 216 lb (98 kg)   SpO2 98%   BMI 42.18 kg/m  BP  Readings from Last 3 Encounters:  06/12/21 129/71  01/07/21 136/87  12/03/20 136/80   Wt Readings from Last 3 Encounters:  06/12/21 216 lb (98 kg)  03/13/21 217 lb (98.4 kg)  01/07/21 216 lb (98 kg)      Physical Exam Vitals reviewed.  Constitutional:      Appearance: Normal appearance. She is obese.  HENT:     Head: Normocephalic.     Right Ear: Tympanic membrane, ear canal and external ear normal. There is no impacted cerumen.     Left Ear: Tympanic membrane, ear canal and external ear normal. There is no impacted cerumen.     Nose: No congestion or rhinorrhea.  Eyes:     General:        Right eye: No discharge.        Left eye: No discharge.     Extraocular Movements: Extraocular movements intact.     Conjunctiva/sclera: Conjunctivae normal.     Pupils: Pupils are equal, round, and reactive to light.  Neck:     Vascular: No carotid bruit.  Cardiovascular:     Rate and Rhythm: Normal rate and regular rhythm.  Pulmonary:  Effort: Pulmonary effort is normal.     Breath sounds: Normal breath sounds.  Musculoskeletal:     Right lower leg: No edema.     Left lower leg: No edema.  Neurological:     General: No focal deficit present.     Mental Status: She is alert.  Psychiatric:        Mood and Affect: Mood normal.   ..    06/12/2021    3:38 PM 12/03/2020   10:24 AM 05/20/2020    5:02 PM 11/02/2019    3:19 PM 05/04/2019    4:08 PM  Depression screen PHQ 2/9  Decreased Interest 1 0 1 1 1   Down, Depressed, Hopeless 0 0 1 1 0  PHQ - 2 Score 1 0 2 2 1   Altered sleeping 0  1 0 0  Tired, decreased energy 2  2 2 1   Change in appetite 0  0 0 0  Feeling bad or failure about yourself  0  0 0 0  Trouble concentrating 1  0 1 1  Moving slowly or fidgety/restless 0  0 0 0  Suicidal thoughts 0  0 0 0  PHQ-9 Score 4  5 5 3   Difficult doing work/chores Somewhat difficult  Not difficult at all Not difficult at all Not difficult at all   ..    06/12/2021    3:40 PM  05/20/2020    5:02 PM 11/02/2019    3:19 PM 05/04/2019    4:10 PM  GAD 7 : Generalized Anxiety Score  Nervous, Anxious, on Edge 1 1 1 2   Control/stop worrying 0 0 1 1  Worry too much - different things 0 1 1 1   Trouble relaxing 1 1 1 1   Restless 0 0 0 0  Easily annoyed or irritable 1 1 1 1   Afraid - awful might happen 0 0 1 0  Total GAD 7 Score 3 4 6 6   Anxiety Difficulty Somewhat difficult Not difficult at all Not difficult at all Not difficult at all        Assessment & Plan:  .8/2/2023Delaina was seen today for follow-up.  Diagnoses and all orders for this visit:  Pre-diabetes -     COMPLETE METABOLIC PANEL WITH GFR  Screening for lipid disorders -     Lipid Panel w/reflex Direct LDL  Thyroid disorder screen -     TSH  Medication management -     TSH -     Lipid Panel w/reflex Direct LDL -     COMPLETE METABOLIC PANEL WITH GFR -     CBC with Differential/Platelet  Anxiety and depression -     clonazePAM (KLONOPIN) 0.5 MG tablet; Take 1 tablet (0.5 mg total) by mouth 2 (two) times daily. -     PARoxetine (PAXIL) 20 MG tablet; Take 1 tablet (20 mg total) by mouth daily.  Mild intermittent asthma without complication -     fluticasone-salmeterol (WIXELA INHUB) 100-50 MCG/ACT AEPB; INHALE 1 PUFF BY MOUTH TWICE A DAY  Vertigo -     Ambulatory referral to Neurology  Tinnitus of left ear -     Ambulatory referral to Neurology   Needs screening labs PHQ/GAD no concerns Refilled paxil and klonapin for 6 months Refilled wixela for asthma Will refer to neurology for vertigo Seen by ENT no dx for vertigo   Return in about 6 months (around 12/12/2021).    05/06/2019, PA-C

## 2021-06-21 NOTE — Progress Notes (Unsigned)
NEUROLOGY CONSULTATION NOTE  [name] MRN: [id number] DOB: [birth year]  Referring provider: Iran Planas, PA-C Primary care provider: Iran Planas, PA-C  Reason for consult:  vertigo, tinnitus  Assessment/Plan:   Vertigo Left sided tinnitus Headache Etiology may include residual symptoms from vestibular neuritis vs vestibular migraine. Elevated blood pressure reading  Given the focal symptoms (left sided tinnitus), we should rule out intracranial as well as inner ear etiology - MRI of brain and IACs with and without contrast To address headaches, start topiramate 25mg  at bedtime.  We can increase to 50mg  at bedtime in 4 weeks if needed. Advil Cold & Sinus as needed.  Limit use of pain relievers to no more than 2 days out of week to prevent risk of rebound or medication-overuse headache. Keep headache/dizziness diary Follow up with PCP regarding blood pressure Follow up in 4 months.    Subjective:  Paula Tucker is a 46 year old female with pre-diabetes, asthma, and anxiety who presents for vertigo.  History supplemented by referring provider's notes.  Around December-January, she developed vertigo, described as spinning sensation.  It wasn't as severe as the vertigo she experienced 2 years prior.  t would occur usually when she would get out of bed in the mornings (or standing up after prolong sitting).  Duration unclear as she would often just go back to sleep for another hour and wake up feeling okay.  She also began having persistent ringing in the left ear but denies hearing loss.  She also developed a mid-occipital pressure headache.  Usually without associated symptoms such as photophobia, phonophobia, or visual disturbance but rarely may have nausea.  At this time, she was also experiencing recurrent sinusitis presenting as congestion and drainage for which she was treated with doxycycline and prednisone.  Symptoms of congestion and drainage continued through January and  then started to improve.  Spells of vertigo resolved but everyday, she may have brief episode of feeling off-balance for no more than a minute.  She continues to have the occipital headaches about 4 days a week.  They last 1 hour to half a day.  Treats with Advil Cold & Sinus.  Tylenol makes her feel nauseous.  She saw ENT.  Meniere's disease was entertained but audiometric testing was normal.    Denies history of migraines but she reports having a stroke-like episode in 2014 with headache, dizziness and speech disturbance.  Workup was negative.  MRI of brain without contrast from 01/27/2012 showed paranasal sinus disease but no acute intracranial findings.  Migraine was suspected.  Current NSAIDS/analgesics:  Advil Cold & Sinus Current triptans:  none Current ergotamine:  none Current anti-emetic:  none Current muscle relaxants:  none Current Antihypertensive medications:  none Current Antidepressant medications:  paroxetine 20mg  daily Current Anticonvulsant medications:  none Other medications:  clonazepam 0.25mg  in AM and 0.5mg  PM (anxiety)  Past NSAIDS/analgesics:  Tylenol Past abortive triptans:  sumatriptan 100mg  Past abortive ergotamine:  none Past muscle relaxants:  none Past anti-emetic:  none Past antihypertensive medications:  lisinopril, HCTZ Past antidepressant medications:  none Past anticonvulsant medications:  gabapentin Past anti-CGRP:  none Past vitamins/Herbal/Supplements:  none Past antihistamines/decongestants:  meclizine, Flonase, Nasonex Other past therapies:  prednsione      PAST MEDICAL HISTORY: Past Medical History:  Diagnosis Date   Anxiety    Asthma     PAST SURGICAL HISTORY: Past Surgical History:  Procedure Laterality Date   cyst on neck      MEDICATIONS: Current Outpatient Medications on  File Prior to Visit  Medication Sig Dispense Refill   albuterol (VENTOLIN HFA) 108 (90 Base) MCG/ACT inhaler Inhale 2 puffs into the lungs every 6 (six)  hours as needed for wheezing. 2 each 11   BUDESONIDE NA ADD 1ML OF MEDICATION TO 240ML OF SALINE IN SALINE IRRIGATION BOTTLE; IRRIGATE SINUSES WITH 120ML THROUGH EACH NOSTRIL TWICE DAILY     clonazePAM (KLONOPIN) 0.5 MG tablet Take 1 tablet (0.5 mg total) by mouth 2 (two) times daily. 60 tablet 5   fluticasone-salmeterol (WIXELA INHUB) 100-50 MCG/ACT AEPB INHALE 1 PUFF BY MOUTH TWICE A DAY 180 each 1   PARoxetine (PAXIL) 20 MG tablet Take 1 tablet (20 mg total) by mouth daily. 90 tablet 1   No current facility-administered medications on file prior to visit.    ALLERGIES: No Known Allergies  FAMILY HISTORY: Family History  Problem Relation Age of Onset   Heart failure Mother    Depression Father    Cancer Maternal Aunt        Melanoma   Depression Maternal Aunt    Cancer Paternal Aunt        Breast Cancer   Diabetes Maternal Grandmother    Cancer Maternal Grandfather        Melanoma   Diabetes Maternal Grandfather    Stroke Maternal Grandfather     Objective:  Blood pressure (!) 160/89, pulse 92, height 5\' 1"  (1.549 m), weight 218 lb (98.9 kg), SpO2 97 %. General: No acute distress.  Patient appears well-groomed.   Head:  Normocephalic/atraumatic Eyes:  fundi examined but not visualized Neck: supple, no paraspinal tenderness, full range of motion Heart: regular rate and rhythm Neurological Exam: Mental status: alert and oriented to person, place, and time, recent and remote memory intact, fund of knowledge intact, attention and concentration intact, speech fluent and not dysarthric, language intact. Cranial nerves: CN I: not tested CN II: pupils equal, round and reactive to light, visual fields intact CN III, IV, VI:  full range of motion, no nystagmus, no ptosis CN V: facial sensation intact. CN VII: upper and lower face symmetric CN VIII: hearing intact CN IX, X: gag intact, uvula midline CN XI: sternocleidomastoid and trapezius muscles intact CN XII: tongue  midline Bulk & Tone: normal, no fasciculations. Motor:  muscle strength 5/5 throughout Sensation:  temperature and vibratory sensation intact. Deep Tendon Reflexes:  2+ throughout,  toes downgoing.   Finger to nose testing:  Without dysmetria.   Heel to shin:  Without dysmetria.   Gait:  Normal station and stride.  Romberg negative.    Thank you for allowing me to take part in the care of this patient.  Metta Clines, DO  CC: Iran Planas, PA-C

## 2021-06-22 ENCOUNTER — Encounter: Payer: Self-pay | Admitting: Neurology

## 2021-06-22 ENCOUNTER — Ambulatory Visit: Payer: BC Managed Care – PPO | Admitting: Neurology

## 2021-06-22 VITALS — BP 160/89 | HR 92 | Ht 61.0 in | Wt 218.0 lb

## 2021-06-22 DIAGNOSIS — R42 Dizziness and giddiness: Secondary | ICD-10-CM | POA: Diagnosis not present

## 2021-06-22 DIAGNOSIS — H9312 Tinnitus, left ear: Secondary | ICD-10-CM | POA: Diagnosis not present

## 2021-06-22 DIAGNOSIS — R519 Headache, unspecified: Secondary | ICD-10-CM | POA: Diagnosis not present

## 2021-06-22 MED ORDER — TOPIRAMATE 25 MG PO TABS: 25.0000 mg | ORAL_TABLET | Freq: Every day | ORAL | 3 refills | Status: DC

## 2021-06-22 NOTE — Patient Instructions (Signed)
Check MRI of brain and internal auditory canals with and without contrast Start topiramate 25mg  at bedtime.  If no improvement in 4 weeks, contact me and we can increase dose Limit use of pain relievers to no more than 2 days out of week to prevent risk of rebound or medication-overuse headache. Keep headache/dizziness diary Follow up 4 months.

## 2021-06-22 NOTE — Addendum Note (Signed)
Addended by: Lenise Herald on: 06/22/2021 12:24 PM   Modules accepted: Orders

## 2021-06-22 NOTE — Addendum Note (Signed)
Addended by: Leida Lauth on: 06/22/2021 01:05 PM   Modules accepted: Orders

## 2021-06-29 ENCOUNTER — Other Ambulatory Visit: Payer: Self-pay

## 2021-06-29 DIAGNOSIS — H9312 Tinnitus, left ear: Secondary | ICD-10-CM

## 2021-07-21 ENCOUNTER — Other Ambulatory Visit: Payer: BC Managed Care – PPO

## 2021-07-30 ENCOUNTER — Ambulatory Visit
Admission: RE | Admit: 2021-07-30 | Discharge: 2021-07-30 | Disposition: A | Discharge status: Home / Self Care | Payer: BC Managed Care – PPO | Source: Ambulatory Visit | Attending: Neurology | Admitting: Neurology

## 2021-07-30 DIAGNOSIS — H9312 Tinnitus, left ear: Secondary | ICD-10-CM

## 2021-07-30 MED ORDER — GADOBENATE DIMEGLUMINE 529 MG/ML IV SOLN
20.0000 mL | Freq: Once | INTRAVENOUS | Status: AC | PRN
  Administered 2021-07-30: 20 mL via INTRAVENOUS

## 2021-07-31 ENCOUNTER — Encounter: Payer: Self-pay | Admitting: Neurology

## 2021-07-31 ENCOUNTER — Telehealth: Payer: Self-pay

## 2021-07-31 DIAGNOSIS — H9312 Tinnitus, left ear: Secondary | ICD-10-CM

## 2021-07-31 DIAGNOSIS — E236 Other disorders of pituitary gland: Secondary | ICD-10-CM

## 2021-07-31 DIAGNOSIS — R519 Headache, unspecified: Secondary | ICD-10-CM

## 2021-07-31 NOTE — Telephone Encounter (Signed)
Referral created and sent to Dr. Neale [facility] in [place].

## 2021-12-18 ENCOUNTER — Ambulatory Visit: Payer: BC Managed Care – PPO | Admitting: Physician Assistant

## 2021-12-30 ENCOUNTER — Ambulatory Visit: Payer: BC Managed Care – PPO | Admitting: Physician Assistant

## 2021-12-30 ENCOUNTER — Encounter: Payer: Self-pay | Admitting: Physician Assistant

## 2021-12-30 ENCOUNTER — Other Ambulatory Visit: Payer: Self-pay | Admitting: Physician Assistant

## 2021-12-30 VITALS — BP 138/78 | HR 81 | Ht 61.0 in | Wt 213.0 lb

## 2021-12-30 DIAGNOSIS — E66813 Obesity, class 3: Secondary | ICD-10-CM

## 2021-12-30 DIAGNOSIS — F32A Depression, unspecified: Secondary | ICD-10-CM | POA: Diagnosis not present

## 2021-12-30 DIAGNOSIS — F419 Anxiety disorder, unspecified: Secondary | ICD-10-CM | POA: Diagnosis not present

## 2021-12-30 DIAGNOSIS — Z6841 Body Mass Index (BMI) 40.0 and over, adult: Secondary | ICD-10-CM

## 2021-12-30 DIAGNOSIS — R030 Elevated blood-pressure reading, without diagnosis of hypertension: Secondary | ICD-10-CM | POA: Diagnosis not present

## 2021-12-30 DIAGNOSIS — Z1231 Encounter for screening mammogram for malignant neoplasm of breast: Secondary | ICD-10-CM

## 2021-12-30 MED ORDER — PAROXETINE HCL 20 MG PO TABS: 20.0000 mg | ORAL_TABLET | Freq: Every day | ORAL | 1 refills | Status: DC

## 2021-12-30 MED ORDER — CLONAZEPAM 0.5 MG PO TABS
0.5000 mg | ORAL_TABLET | Freq: Two times a day (BID) | ORAL | 5 refills | Status: DC
Start: 2021-12-30 — End: 2022-07-02

## 2021-12-30 NOTE — Progress Notes (Signed)
Established Patient Office Visit  Subjective   Patient ID: Paula Tucker, female    DOB: 1975-03-29  Age: 46 y.o. MRN: [id number]  Chief Complaint  Patient presents with   Follow-up    Medication     HPI Pt is a 46 yo obese female with MDD, anxiety, asthma who presents to the clinic for medication refills.   Overall mood is great. No concerns or changes. Pt is not checking BP at home. She is exercising more and lost 15lbs in last few months. Denies any headaches, CP, palpitations, vision changs. Dizziness is much better.   .. Active Ambulatory Problems    Diagnosis Date Noted   Anxiety and depression 06/14/2011   Asthma 06/14/2011   Hypertension 07/29/2011   Migraine 03/04/2012   Skin nodule 03/10/2015   Tinnitus of left ear 02/04/2016   Abnormal otoscopic exam of left ear 02/04/2016   Sinusitis with nasal polyps 04/14/2016   Class 3 severe obesity due to excess calories without serious comorbidity with body mass index (BMI) of 40.0 to 44.9 in adult [facility]) 09/19/2016   Anxiety 10/05/2017   Acute dysfunction of Eustachian tube, left 10/14/2017   Vertigo 10/18/2017   Benign breast cyst in female, left 10/18/2018   Pre-diabetes 11/27/2019   Elevated blood pressure reading 12/30/2021   Resolved Ambulatory Problems    Diagnosis Date Noted   Ear pressure, left 02/04/2016   Past Medical History:  Diagnosis Date   Asthma       ROS   See HPI.  Objective:     BP 138/78   Pulse 81   Ht 5\' 1"  (1.549 m)   Wt 213 lb (96.6 kg)   SpO2 96%   BMI 40.25 kg/m  BP Readings from Last 3 Encounters:  12/30/21 138/78  06/22/21 (!) 160/89  06/12/21 129/71   Wt Readings from Last 3 Encounters:  12/30/21 213 lb (96.6 kg)  06/22/21 218 lb (98.9 kg)  06/12/21 216 lb (98 kg)    ..    12/30/2021   11:34 AM 06/12/2021    3:38 PM 12/03/2020   10:24 AM 05/20/2020    5:02 PM 11/02/2019    3:19 PM  Depression screen PHQ 2/9  Decreased Interest 1 1 0 1 1  Down, Depressed,  Hopeless 1 0 0 1 1  PHQ - 2 Score 2 1 0 2 2  Altered sleeping 1 0  1 0  Tired, decreased energy 1 2  2 2   Change in appetite 0 0  0 0  Feeling bad or failure about yourself  0 0  0 0  Trouble concentrating 0 1  0 1  Moving slowly or fidgety/restless 0 0  0 0  Suicidal thoughts 0 0  0 0  PHQ-9 Score 4 4  5 5   Difficult doing work/chores Not difficult at all Somewhat difficult  Not difficult at all Not difficult at all   ..    12/30/2021   11:34 AM 06/12/2021    3:40 PM 05/20/2020    5:02 PM 11/02/2019    3:19 PM  GAD 7 : Generalized Anxiety Score  Nervous, Anxious, on Edge 1 1 1 1   Control/stop worrying 0 0 0 1  Worry too much - different things 1 0 1 1  Trouble relaxing 0 1 1 1   Restless 0 0 0 0  Easily annoyed or irritable 1 1 1 1   Afraid - awful might happen 0 0 0 1  Total GAD 7 Score  3 3 4 6   Anxiety Difficulty Somewhat difficult Somewhat difficult Not difficult at all Not difficult at all      Physical Exam Constitutional:      Appearance: Normal appearance.  HENT:     Head: Normocephalic.  Cardiovascular:     Rate and Rhythm: Normal rate and regular rhythm.  Pulmonary:     Effort: Pulmonary effort is normal.  Musculoskeletal:     Right lower leg: No edema.     Left lower leg: No edema.  Neurological:     General: No focal deficit present.     Mental Status: She is alert.  Psychiatric:        Mood and Affect: Mood normal.       Assessment & Plan:  . Sya was seen today for follow-up.  Diagnoses and all orders for this visit:  Anxiety and depression -     clonazePAM (KLONOPIN) 0.5 MG tablet; Take 1 tablet (0.5 mg total) by mouth 2 (two) times daily. -     PARoxetine (PAXIL) 20 MG tablet; Take 1 tablet (20 mg total) by mouth daily.  Elevated blood pressure reading   PHQ and GAD numbers are controlled and stable Refilled paxil and klonapin  Discussed elevated BP reading 2nd recheck was better DASH diet Regular exercise Weight loss Decrease  sodium in diet Start checking BP at home and keep log  .Boneta LucksDiscussed low carb diet with 1500 calories and 80g of protein.  Exercising at least 150 minutes a week.  My Fitness Pal could be a Marland Kitchen.   Needs labs but due to insurance cost need to wait until next year.   Follow up in 6 months    Return in about 6 months (around 07/01/2022).    07/03/2022, PA-C

## 2021-12-30 NOTE — Patient Instructions (Signed)

## 2022-01-20 ENCOUNTER — Ambulatory Visit: Payer: BC Managed Care – PPO

## 2022-02-24 ENCOUNTER — Ambulatory Visit: Payer: BC Managed Care – PPO

## 2022-03-04 ENCOUNTER — Ambulatory Visit (INDEPENDENT_AMBULATORY_CARE_PROVIDER_SITE_OTHER): Payer: BC Managed Care – PPO

## 2022-03-04 DIAGNOSIS — Z1231 Encounter for screening mammogram for malignant neoplasm of breast: Secondary | ICD-10-CM | POA: Diagnosis not present

## 2022-03-08 NOTE — Progress Notes (Signed)
Right breast asymmetry. Will need to get diagnostic mammogram. Imaging should be reaching out to you.

## 2022-03-09 ENCOUNTER — Other Ambulatory Visit: Payer: Self-pay | Admitting: Physician Assistant

## 2022-03-09 DIAGNOSIS — R928 Other abnormal and inconclusive findings on diagnostic imaging of breast: Secondary | ICD-10-CM

## 2022-03-22 ENCOUNTER — Other Ambulatory Visit: Payer: BC Managed Care – PPO

## 2022-03-23 ENCOUNTER — Ambulatory Visit
Admission: RE | Admit: 2022-03-23 | Discharge: 2022-03-23 | Disposition: A | Discharge status: Home / Self Care | Payer: BC Managed Care – PPO | Source: Ambulatory Visit | Attending: Physician Assistant | Admitting: Physician Assistant

## 2022-03-23 ENCOUNTER — Ambulatory Visit: Admission: RE | Admit: 2022-03-23 | Payer: BC Managed Care – PPO | Source: Ambulatory Visit

## 2022-03-23 DIAGNOSIS — R928 Other abnormal and inconclusive findings on diagnostic imaging of breast: Secondary | ICD-10-CM

## 2022-03-24 NOTE — Progress Notes (Signed)
Normal mammogram. Follow up in 1 year.

## 2022-03-25 IMAGING — MG MM DIGITAL SCREENING BILAT W/ TOMO AND CAD
8 series · 8 of 24 positions shown · non-contrast
Comparison: Previous exam(s).

CLINICAL DATA: Screening.

EXAM:
DIGITAL SCREENING BILATERAL MAMMOGRAM WITH TOMOSYNTHESIS AND CAD
TECHNIQUE: Bilateral screening digital craniocaudal and mediolateral oblique
mammograms were obtained. Bilateral screening digital breast
tomosynthesis was performed. The images were evaluated with
computer-aided detection.

[L CC synth-2D]
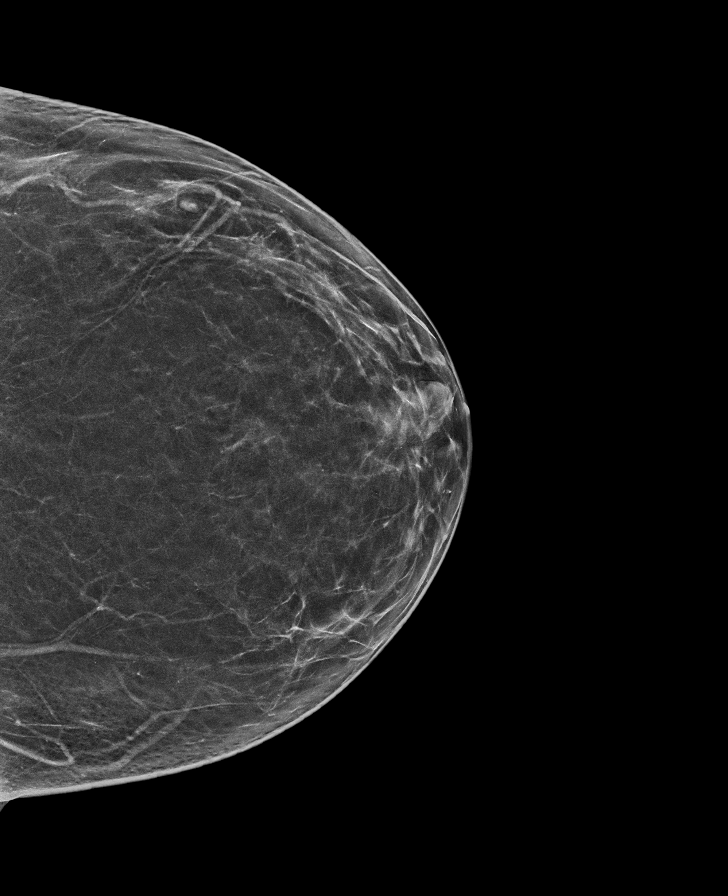

[L MLO synth-2D]
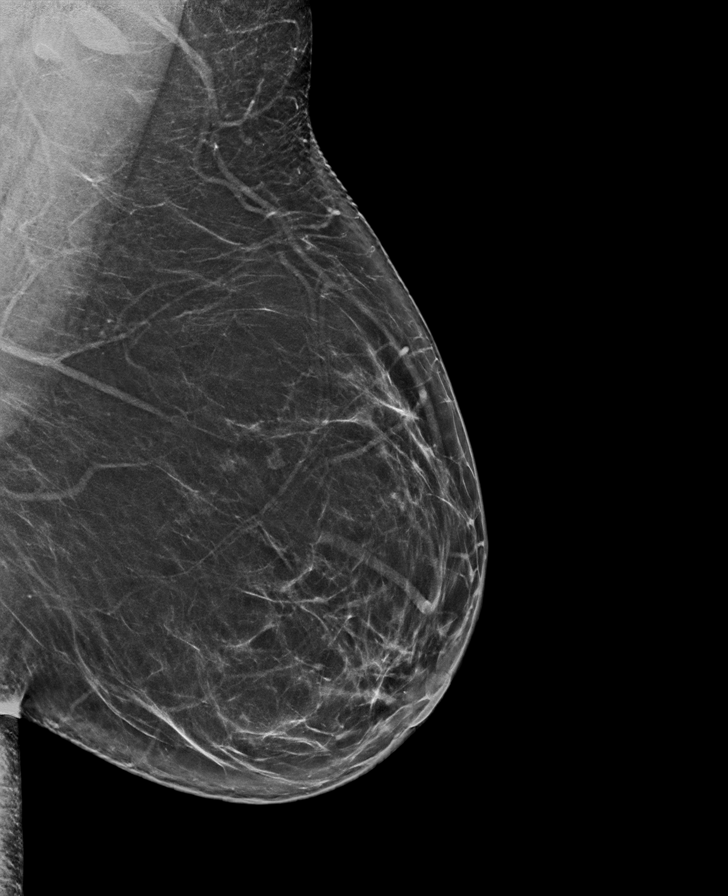

[R MLO synth-2D]
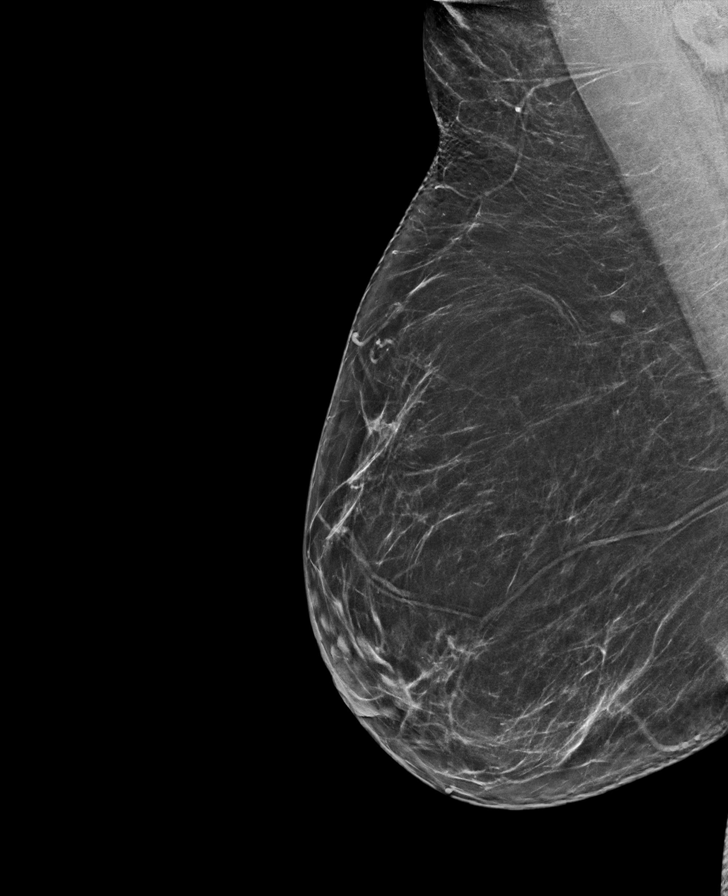

[R CC synth-2D]
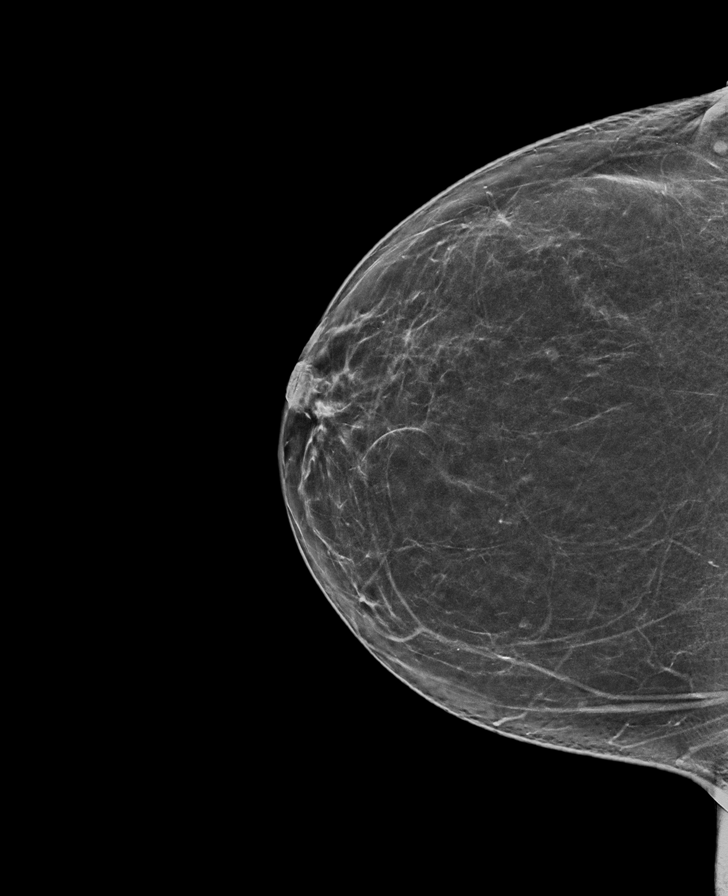

[L CC tomo · tomo slice 37/73.0]
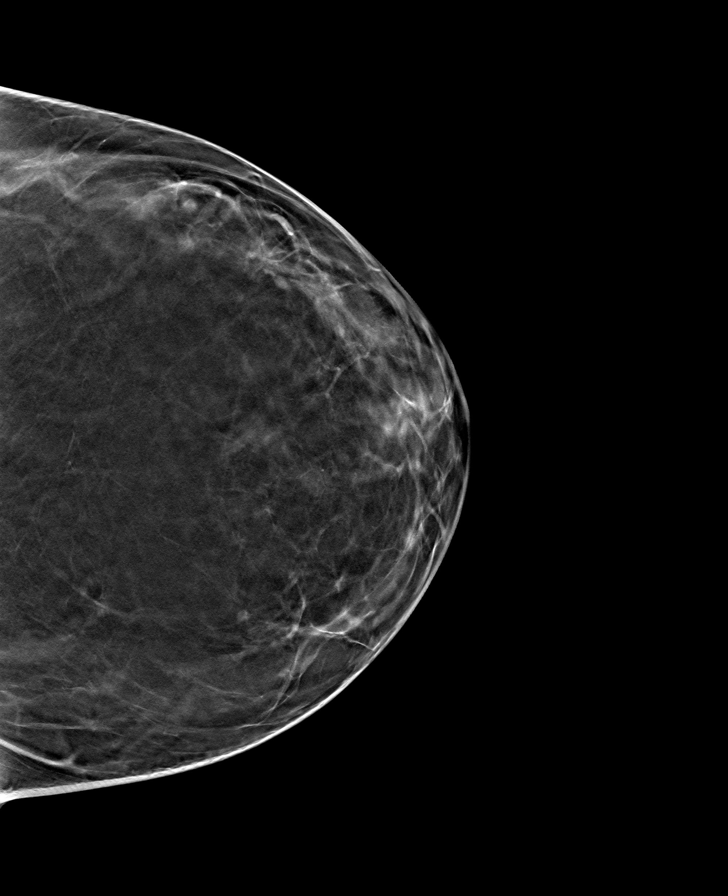

[R CC tomo · tomo slice 37/73.0]
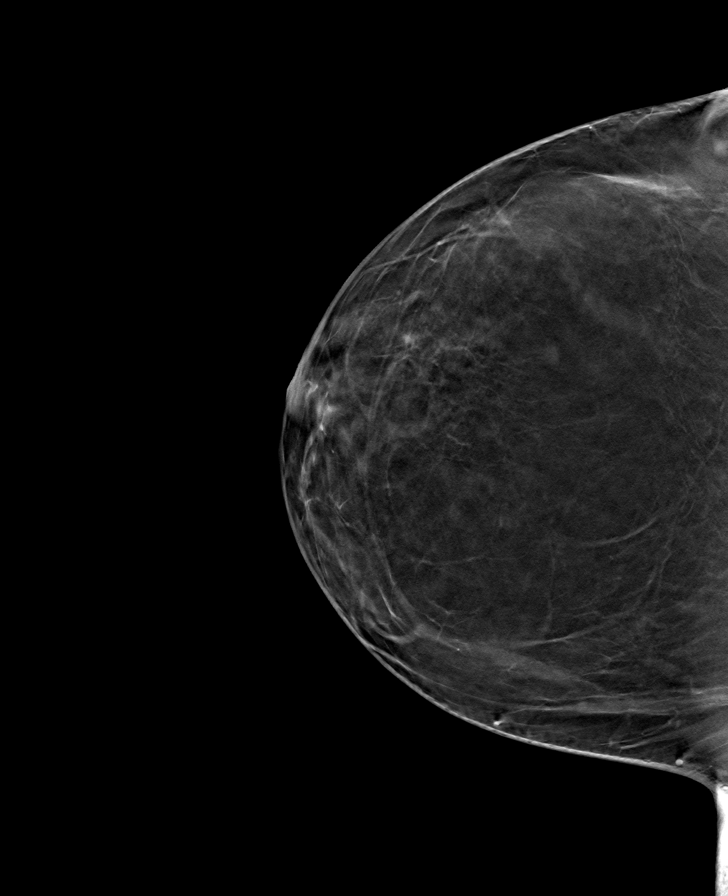

[L MLO tomo · tomo slice 42/83.0]
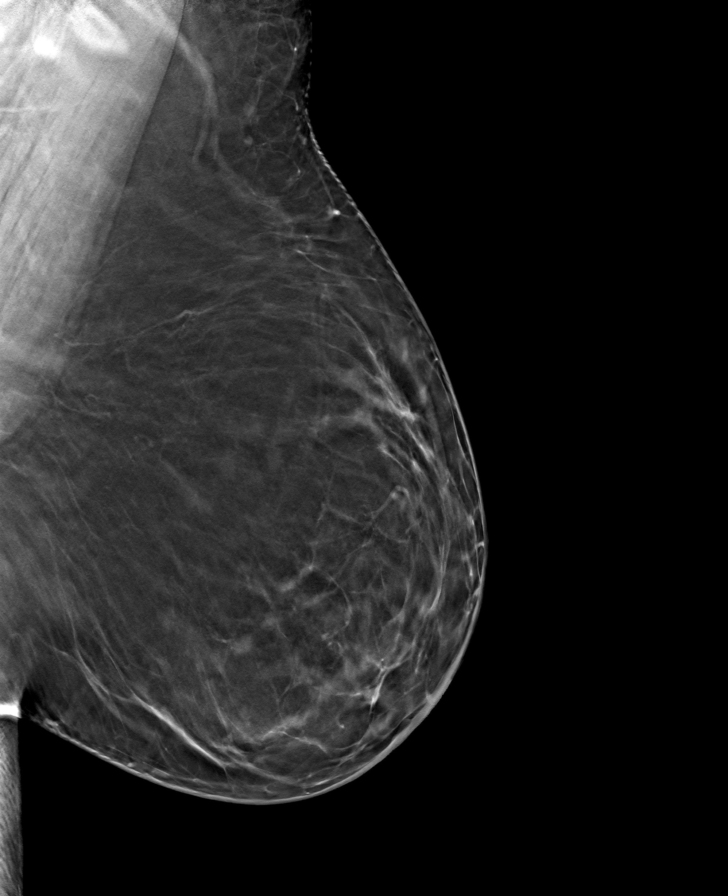

[R MLO tomo · tomo slice 39/78.0]
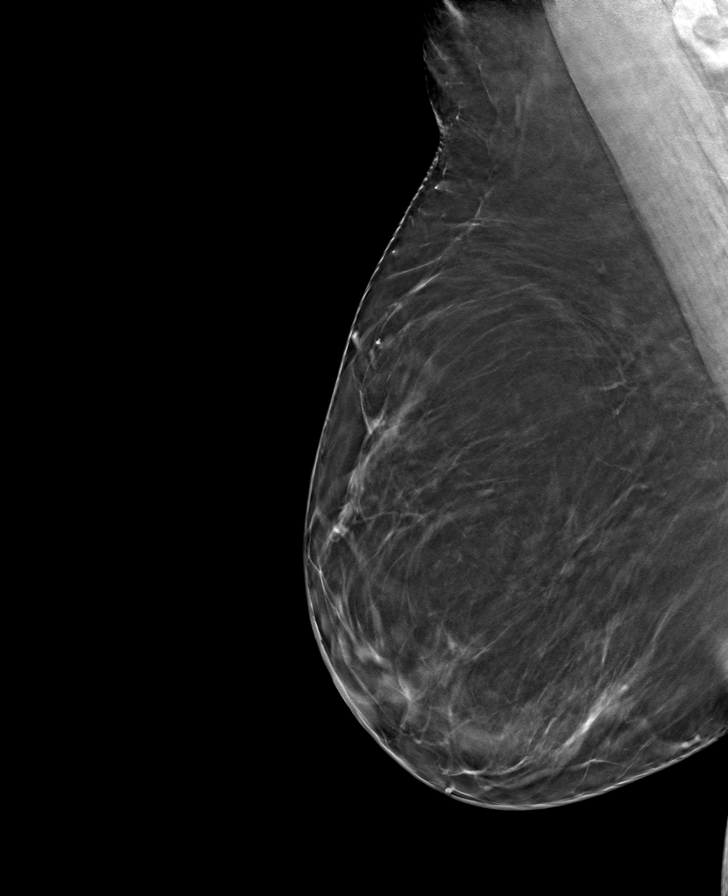

[8 of 24 positions shown; findings below may reference images not displayed]

ACR Breast Density Category b: There are scattered areas of
fibroglandular density.
FINDINGS: There are no findings suspicious for malignancy.
IMPRESSION: No mammographic evidence of malignancy. A result letter of this
screening mammogram will be mailed directly to the patient.

RECOMMENDATION:
Screening mammogram in one year. (Code:51-O-LD2)

BI-RADS CATEGORY  1: Negative.

## 2022-07-02 ENCOUNTER — Ambulatory Visit: Payer: BC Managed Care – PPO | Admitting: Physician Assistant

## 2022-07-02 ENCOUNTER — Encounter: Payer: Self-pay | Admitting: Physician Assistant

## 2022-07-02 VITALS — BP 125/79 | HR 85 | Ht 61.0 in | Wt 206.0 lb

## 2022-07-02 DIAGNOSIS — I10 Essential (primary) hypertension: Secondary | ICD-10-CM | POA: Diagnosis not present

## 2022-07-02 DIAGNOSIS — Z23 Encounter for immunization: Secondary | ICD-10-CM

## 2022-07-02 DIAGNOSIS — Z0000 Encounter for general adult medical examination without abnormal findings: Secondary | ICD-10-CM

## 2022-07-02 DIAGNOSIS — F419 Anxiety disorder, unspecified: Secondary | ICD-10-CM | POA: Diagnosis not present

## 2022-07-02 DIAGNOSIS — R7303 Prediabetes: Secondary | ICD-10-CM | POA: Diagnosis not present

## 2022-07-02 DIAGNOSIS — Z13220 Encounter for screening for lipoid disorders: Secondary | ICD-10-CM

## 2022-07-02 DIAGNOSIS — F32A Depression, unspecified: Secondary | ICD-10-CM

## 2022-07-02 MED ORDER — PAROXETINE HCL 20 MG PO TABS: 20.0000 mg | ORAL_TABLET | Freq: Every day | ORAL | 1 refills | Status: DC

## 2022-07-02 MED ORDER — CLONAZEPAM 0.5 MG PO TABS
0.5000 mg | ORAL_TABLET | Freq: Two times a day (BID) | ORAL | 5 refills | Status: DC
Start: 2022-07-02 — End: 2023-01-03

## 2022-07-02 NOTE — Progress Notes (Unsigned)
Established Patient Office Visit  Subjective   Patient ID: Paula Tucker, female    DOB: 12/10/1975  Age: 47 y.o. MRN: [id number]  Chief Complaint  Patient presents with   Follow-up    HPI Pt is a 47 yo female who presents to the clinic for medication refills and labs. No concerns.     .. Active Ambulatory Problems    Diagnosis Date Noted   Anxiety and depression 06/14/2011   Asthma 06/14/2011   Hypertension 07/29/2011   Migraine 03/04/2012   Skin nodule 03/10/2015   Tinnitus of left ear 02/04/2016   Abnormal otoscopic exam of left ear 02/04/2016   Sinusitis with nasal polyps 04/14/2016   Class 3 severe obesity due to excess calories without serious comorbidity with body mass index (BMI) of 40.0 to 44.9 in adult [facility]) 09/19/2016   Anxiety 10/05/2017   Acute dysfunction of Eustachian tube, left 10/14/2017   Vertigo 10/18/2017   Benign breast cyst in female, left 10/18/2018   Pre-diabetes 11/27/2019   Elevated blood pressure reading 12/30/2021   Resolved Ambulatory Problems    Diagnosis Date Noted   Ear pressure, left 02/04/2016   Past Medical History:  Diagnosis Date   Asthma     ROS See HPI.    Objective:     BP 125/79 (BP Location: Right Arm, Patient Position: Sitting, Cuff Size: Normal)   Pulse 85   Ht 5\' 1"  (1.549 m)   Wt 206 lb (93.4 kg)   SpO2 97%   BMI 38.92 kg/m  BP Readings from Last 3 Encounters:  07/02/22 125/79  12/30/21 138/78  06/22/21 (!) 160/89   Wt Readings from Last 3 Encounters:  07/02/22 206 lb (93.4 kg)  12/30/21 213 lb (96.6 kg)  06/22/21 218 lb (98.9 kg)    ..    07/02/2022    3:25 PM 07/02/2022    3:24 PM 12/30/2021   11:34 AM 06/12/2021    3:38 PM 12/03/2020   10:24 AM  Depression screen PHQ 2/9  Decreased Interest 1 1 1 1  0  Down, Depressed, Hopeless 1 1 1  0 0  PHQ - 2 Score 2 2 2 1  0  Altered sleeping 1  1 0   Tired, decreased energy 1  1 2    Change in appetite 0  0 0   Feeling bad or failure about yourself  0  0  0   Trouble concentrating 0  0 1   Moving slowly or fidgety/restless 0  0 0   Suicidal thoughts 0  0 0   PHQ-9 Score 4  4 4    Difficult doing work/chores Somewhat difficult  Not difficult at all Somewhat difficult    ..    07/02/2022    3:26 PM 12/30/2021   11:34 AM 06/12/2021    3:40 PM 05/20/2020    5:02 PM  GAD 7 : Generalized Anxiety Score  Nervous, Anxious, on Edge 1 1 1 1   Control/stop worrying 0 0 0 0  Worry too much - different things 0 1 0 1  Trouble relaxing 1 0 1 1  Restless 0 0 0 0  Easily annoyed or irritable 1 1 1 1   Afraid - awful might happen 0 0 0 0  Total GAD 7 Score 3 3 3 4   Anxiety Difficulty Somewhat difficult Somewhat difficult Somewhat difficult Not difficult at all      Physical Exam Constitutional:      Appearance: Normal appearance. She is obese.  HENT:  Head: Normocephalic.  Cardiovascular:     Rate and Rhythm: Normal rate and regular rhythm.     Pulses: Normal pulses.  Pulmonary:     Effort: Pulmonary effort is normal.     Breath sounds: Normal breath sounds.  Musculoskeletal:     Right lower leg: No edema.     Left lower leg: No edema.  Neurological:     General: No focal deficit present.     Mental Status: She is alert and oriented to person, place, and time.  Psychiatric:        Mood and Affect: Mood normal.      The 10-year ASCVD risk score (Arnett DK, et al., 2019) is: 0.7%    Assessment & Plan:  .Marland KitchenMychele was seen today for follow-up.  Diagnoses and all orders for this visit:  Routine physical examination -     Lipid Panel w/reflex Direct LDL -     COMPLETE METABOLIC PANEL WITH GFR -     TSH -     CBC w/Diff/Platelet -     VITAMIN D 25 Hydroxy (Vit-D Deficiency, Fractures) -     Hemoglobin A1c  Need for tetanus booster -     Tdap vaccine greater than or equal to 7yo IM  Anxiety and depression -     PARoxetine (PAXIL) 20 MG tablet; Take 1 tablet (20 mg total) by mouth daily. -     clonazePAM (KLONOPIN) 0.5 MG  tablet; Take 1 tablet (0.5 mg total) by mouth 2 (two) times daily.  Pre-diabetes -     COMPLETE METABOLIC PANEL WITH GFR -     Hemoglobin A1c  Hypertension, unspecified type -     TSH -     CBC w/Diff/Platelet  Screening for lipid disorders -     Lipid Panel w/reflex Direct LDL   PHQ and GAD stable refilled paxil and klonapin Tdap given today Fasting labs ordered Vaccines UTD    Return in about 6 months (around 01/01/2023).    Tandy Gaw, PA-C

## 2022-07-06 ENCOUNTER — Encounter: Payer: Self-pay | Admitting: Physician Assistant

## 2022-07-20 ENCOUNTER — Telehealth: Payer: Self-pay | Admitting: Physician Assistant

## 2022-07-20 MED ORDER — FLUTICASONE-SALMETEROL 115-21 MCG/ACT IN AERO: 2.0000 | INHALATION_SPRAY | Freq: Two times a day (BID) | RESPIRATORY_TRACT | 3 refills | Status: DC

## 2022-07-20 NOTE — Telephone Encounter (Signed)
Patient called she is requesting a refill on Advaur GFT 115-21 MCG/ACT inhaler  It was denied per her pharmacy Please call pharmacy [phone]

## 2022-07-24 ENCOUNTER — Other Ambulatory Visit: Payer: Self-pay | Admitting: Family Medicine

## 2022-07-24 ENCOUNTER — Encounter: Payer: Self-pay | Admitting: Family Medicine

## 2022-07-24 MED ORDER — FLUTICASONE-SALMETEROL 100-50 MCG/ACT IN AEPB: 1.0000 | INHALATION_SPRAY | Freq: Two times a day (BID) | RESPIRATORY_TRACT | 3 refills | Status: DC

## 2022-11-19 ENCOUNTER — Ambulatory Visit: Payer: BC Managed Care – PPO | Admitting: Medical-Surgical

## 2022-11-22 ENCOUNTER — Ambulatory Visit: Payer: BC Managed Care – PPO | Admitting: Family Medicine

## 2022-11-22 ENCOUNTER — Encounter: Payer: Self-pay | Admitting: Family Medicine

## 2022-11-22 VITALS — BP 119/83 | HR 96 | Temp 98.2°F | Ht 61.0 in | Wt 206.0 lb

## 2022-11-22 DIAGNOSIS — J0100 Acute maxillary sinusitis, unspecified: Secondary | ICD-10-CM | POA: Insufficient documentation

## 2022-11-22 MED ORDER — FLUTICASONE-SALMETEROL 100-50 MCG/ACT IN AEPB: 1.0000 | INHALATION_SPRAY | Freq: Two times a day (BID) | RESPIRATORY_TRACT | 3 refills | Status: DC

## 2022-11-22 MED ORDER — DOXYCYCLINE HYCLATE 100 MG PO TABS: 100.0000 mg | ORAL_TABLET | Freq: Two times a day (BID) | ORAL | 0 refills | Status: AC

## 2022-11-22 MED ORDER — PREDNISONE 50 MG PO TABS
ORAL_TABLET | ORAL | 0 refills | Status: DC
Start: 2022-11-22 — End: 2023-01-03

## 2022-11-22 NOTE — Assessment & Plan Note (Addendum)
Treating aggressively with course of doxycycline and prednisone.  Recommend continued supportive care with increase fluids and rest.  Precautions and red flags discussed.

## 2022-11-22 NOTE — Progress Notes (Signed)
Paula Tucker - 47 y.o. female MRN [id number]  Date of birth: 1975/07/06  Subjective Chief Complaint  Patient presents with   Sinusitis    HPI Paula Tucker is a 47 y.o. female here today with complaint of nasal congestion, ear pain/pressure, and sinus pain.  Symptoms started about 9 days ago. She has history of sinus surgery in the past and had done pretty well until recently.  She does have a cough but denies wheezing or dyspnea.  She is using sinus rinse regularly but is still having symptoms.  She denies fever or chills.   ROS:  A comprehensive ROS was completed and negative except as noted per HPI  Allergies  Allergen Reactions   Topamax [Topiramate]     Numbness on one side of face.     Past Medical History:  Diagnosis Date   Anxiety    Asthma     Past Surgical History:  Procedure Laterality Date   cyst on neck      Social History   Socioeconomic History   Marital status: Married    Spouse name: Not on file   Number of children: Not on file   Years of education: Not on file   Highest education level: Not on file  Occupational History   Not on file  Tobacco Use   Smoking status: Never   Smokeless tobacco: Never  Vaping Use   Vaping status: Never Used  Substance and Sexual Activity   Alcohol use: No   Drug use: No   Sexual activity: Yes    Birth control/protection: Pill  Other Topics Concern   Not on file  Social History Narrative   Right handed    Lives with husband and 2 dogs   Two story home   Caffeine 1 cup every other day   Teacher    Social Determinants of Health   Financial Resource Strain: Not on file  Food Insecurity: Not on file  Transportation Needs: Not on file  Physical Activity: Not on file  Stress: Not on file  Social Connections: Unknown (01/20/2022)   Received from [facility]   Social Network    Social Network: Not on file    Family History  Problem Relation Age of Onset   Heart failure Mother    Depression  Father    Diabetes Father    Diabetes Maternal Grandmother    Cancer Maternal Grandfather        Melanoma   Diabetes Maternal Grandfather    Stroke Maternal Grandfather    Cancer Maternal Aunt        Melanoma   Depression Maternal Aunt    Cancer Paternal Aunt        Breast Cancer    Health Maintenance  Topic Date Due   INFLUENZA VACCINE  Never done   COVID-19 Vaccine (5 - 2023-24 season) 09/12/2022   Colonoscopy  Never done   Hepatitis C Screening  12/31/2022 (Originally 11/15/1995)   HIV Screening  12/31/2022 (Originally 11/14/1992)   MAMMOGRAM  03/05/2023   Cervical Cancer Screening (HPV/Pap Cotest)  11/03/2023   HPV VACCINES  Aged Out   DTaP/Tdap/Td  Discontinued     ----------------------------------------------------------------------------------------------------------------------------------------------------------------------------------------------------------------- Physical Exam BP 119/83 (BP Location: Left Arm, Patient Position: Sitting, Cuff Size: Normal)   Pulse 96   Temp 98.2 F (36.8 C) (Oral)   Ht 5\' 1"  (1.549 m)   Wt 206 lb (93.4 kg)   SpO2 98%   BMI 38.92 kg/m   Physical Exam  Constitutional:      Appearance: Normal appearance.  HENT:     Head: Normocephalic and atraumatic.     Nose: Congestion present.     Comments: Maxillary sinus tenderness b/l    Mouth/Throat:     Mouth: Mucous membranes are moist.  Eyes:     General: No scleral icterus. Cardiovascular:     Rate and Rhythm: Normal rate and regular rhythm.  Pulmonary:     Effort: Pulmonary effort is normal.     Breath sounds: Normal breath sounds.  Neurological:     Mental Status: She is alert.  Psychiatric:        Mood and Affect: Mood normal.        Behavior: Behavior normal.      ------------------------------------------------------------------------------------------------------------------------------------------------------------------------------------------------------------------- Assessment and Plan  Acute maxillary sinusitis Treating aggressively with course of doxycycline and prednisone.  Recommend continued supportive care with increase fluids and rest.  Precautions and red flags discussed.    Meds ordered this encounter  Medications   doxycycline (VIBRA-TABS) 100 MG tablet    Sig: Take 1 tablet (100 mg total) by mouth 2 (two) times daily for 7 days.    Dispense:  14 tablet    Refill:  0   predniSONE (DELTASONE) 50 MG tablet    Sig: Take 50mg  daily x5 days.    Dispense:  5 tablet    Refill:  0   fluticasone-salmeterol (ADVAIR) 100-50 MCG/ACT AEPB    Sig: Inhale 1 puff into the lungs 2 (two) times daily.    Dispense:  1 each    Refill:  3    No follow-ups on file.    This visit occurred during the SARS-CoV-2 public health emergency.  Safety protocols were in place, including screening questions prior to the visit, additional usage of staff PPE, and extensive cleaning of exam room while observing appropriate contact time as indicated for disinfecting solutions.

## 2023-01-03 ENCOUNTER — Other Ambulatory Visit: Payer: Self-pay

## 2023-01-03 ENCOUNTER — Ambulatory Visit: Payer: BC Managed Care – PPO | Admitting: Physician Assistant

## 2023-01-03 ENCOUNTER — Encounter: Payer: Self-pay | Admitting: Physician Assistant

## 2023-01-03 VITALS — BP 139/73 | HR 71 | Temp 98.1°F | Ht 61.0 in | Wt 213.0 lb

## 2023-01-03 DIAGNOSIS — I10 Essential (primary) hypertension: Secondary | ICD-10-CM

## 2023-01-03 DIAGNOSIS — R011 Cardiac murmur, unspecified: Secondary | ICD-10-CM

## 2023-01-03 DIAGNOSIS — Z1211 Encounter for screening for malignant neoplasm of colon: Secondary | ICD-10-CM | POA: Diagnosis not present

## 2023-01-03 DIAGNOSIS — J4520 Mild intermittent asthma, uncomplicated: Secondary | ICD-10-CM

## 2023-01-03 DIAGNOSIS — F419 Anxiety disorder, unspecified: Secondary | ICD-10-CM

## 2023-01-03 DIAGNOSIS — Z79899 Other long term (current) drug therapy: Secondary | ICD-10-CM

## 2023-01-03 DIAGNOSIS — F32A Depression, unspecified: Secondary | ICD-10-CM

## 2023-01-03 DIAGNOSIS — E66813 Obesity, class 3: Secondary | ICD-10-CM

## 2023-01-03 DIAGNOSIS — Z6841 Body Mass Index (BMI) 40.0 and over, adult: Secondary | ICD-10-CM

## 2023-01-03 DIAGNOSIS — Z0000 Encounter for general adult medical examination without abnormal findings: Secondary | ICD-10-CM

## 2023-01-03 DIAGNOSIS — R7303 Prediabetes: Secondary | ICD-10-CM

## 2023-01-03 MED ORDER — FLUTICASONE-SALMETEROL 100-50 MCG/ACT IN AEPB: 1.0000 | INHALATION_SPRAY | Freq: Two times a day (BID) | RESPIRATORY_TRACT | 3 refills | Status: DC

## 2023-01-03 MED ORDER — PAROXETINE HCL 20 MG PO TABS: 20.0000 mg | ORAL_TABLET | Freq: Every day | ORAL | 1 refills | Status: DC

## 2023-01-03 MED ORDER — CLONAZEPAM 0.5 MG PO TABS: 0.5000 mg | ORAL_TABLET | Freq: Two times a day (BID) | ORAL | 5 refills | Status: DC

## 2023-01-03 NOTE — Progress Notes (Signed)
Established Patient Office Visit  Subjective   Patient ID: Paula Tucker, female    DOB: 03/01/1975  Age: 47 y.o. MRN: [id number]  Chief Complaint  Patient presents with   Medical Management of Chronic Issues    Med refills    HPI Pt is a 47 yo female who presents to the clinic for medications refills. She is doing well. She has no concerns. Her mood is well controlled. She denies any SOB, CP, swelling, palpitations. She is not exercising. She is taking her medications daily. She has no problems with breathing.   Patient Active Problem List   Diagnosis Date Noted   Newly recognized heart murmur 01/07/2023   Acute maxillary sinusitis 11/22/2022   Elevated blood pressure reading 12/30/2021   Pre-diabetes 11/27/2019   Benign breast cyst in female, left 10/18/2018   Vertigo 10/18/2017   Acute dysfunction of Eustachian tube, left 10/14/2017   Anxiety 10/05/2017   Class 3 severe obesity due to excess calories without serious comorbidity with body mass index (BMI) of 40.0 to 44.9 in adult [facility]) 09/19/2016   Sinusitis with nasal polyps 04/14/2016   Tinnitus of left ear 02/04/2016   Abnormal otoscopic exam of left ear 02/04/2016   Skin nodule 03/10/2015   Migraine 03/04/2012   Hypertension 07/29/2011   Anxiety and depression 06/14/2011   Asthma 06/14/2011   Past Medical History:  Diagnosis Date   Anxiety    Asthma    Past Surgical History:  Procedure Laterality Date   cyst on neck     Family Status  Relation Name Status   Mother  Alive   Father  Alive   MGM  (Not Specified)   MGF  (Not Specified)   Mat Aunt  (Not Specified)   [employer]  (Not Specified)  No partnership data on file   Family History  Problem Relation Age of Onset   Heart failure Mother    Depression Father    Diabetes Father    Diabetes Maternal Grandmother    Cancer Maternal Grandfather        Melanoma   Diabetes Maternal Grandfather    Stroke Maternal Grandfather    Cancer Maternal Aunt         Melanoma   Depression Maternal Aunt    Cancer Paternal Aunt        Breast Cancer   Allergies  Allergen Reactions   Topamax [Topiramate]     Numbness on one side of face.       Review of Systems  All other systems reviewed and are negative.     Objective:     BP 139/73   Pulse 71   Temp 98.1 F (36.7 C) (Oral)   Ht 5\' 1"  (1.549 m)   Wt 213 lb (96.6 kg)   SpO2 99%   BMI 40.25 kg/m  BP Readings from Last 3 Encounters:  01/03/23 139/73  11/22/22 119/83  07/02/22 125/79   Wt Readings from Last 3 Encounters:  01/03/23 213 lb (96.6 kg)  11/22/22 206 lb (93.4 kg)  07/02/22 206 lb (93.4 kg)    ..    01/07/2023    6:22 AM 07/02/2022    3:25 PM 07/02/2022    3:24 PM 12/30/2021   11:34 AM 06/12/2021    3:38 PM  Depression screen PHQ 2/9  Decreased Interest 0 1 1 1 1   Down, Depressed, Hopeless 0 1 1 1  0  PHQ - 2 Score 0 2 2 2 1   Altered sleeping  0 1  1 0  Tired, decreased energy 1 1  1 2   Change in appetite 0 0  0 0  Feeling bad or failure about yourself  0 0  0 0  Trouble concentrating 0 0  0 1  Moving slowly or fidgety/restless 0 0  0 0  Suicidal thoughts 0 0  0 0  PHQ-9 Score 1 4  4 4   Difficult doing work/chores Not difficult at all Somewhat difficult  Not difficult at all Somewhat difficult   ..    01/07/2023    6:22 AM 07/02/2022    3:26 PM 12/30/2021   11:34 AM 06/12/2021    3:40 PM  GAD 7 : Generalized Anxiety Score  Nervous, Anxious, on Edge 1 1 1 1   Control/stop worrying 0 0 0 0  Worry too much - different things 1 0 1 0  Trouble relaxing 0 1 0 1  Restless 0 0 0 0  Easily annoyed or irritable 1 1 1 1   Afraid - awful might happen 0 0 0 0  Total GAD 7 Score 3 3 3 3   Anxiety Difficulty Not difficult at all Somewhat difficult Somewhat difficult Somewhat difficult      Physical Exam Constitutional:      Appearance: Normal appearance. She is obese.  HENT:     Head: Normocephalic.  Cardiovascular:     Rate and Rhythm: Normal rate and regular  rhythm.     Heart sounds: Murmur heard.     Comments: 3/6 SEM, heard best over right intercostal space.  Pulmonary:     Effort: Pulmonary effort is normal.  Musculoskeletal:     Cervical back: Normal range of motion and neck supple. No tenderness.     Right lower leg: No edema.     Left lower leg: No edema.  Lymphadenopathy:     Cervical: No cervical adenopathy.  Neurological:     General: No focal deficit present.     Mental Status: She is alert and oriented to person, place, and time.  Psychiatric:        Mood and Affect: Mood normal.          Assessment & Plan:  .Marland KitchenCilia was seen today for medical management of chronic issues.  Diagnoses and all orders for this visit:  Anxiety and depression -     clonazePAM (KLONOPIN) 0.5 MG tablet; Take 1 tablet (0.5 mg total) by mouth 2 (two) times daily. -     PARoxetine (PAXIL) 20 MG tablet; Take 1 tablet (20 mg total) by mouth daily.  Colon cancer screening -     Cologuard  Hypertension, unspecified type  Newly recognized heart murmur -     ECHOCARDIOGRAM COMPLETE; Future  Pre-diabetes  Class 3 severe obesity due to excess calories without serious comorbidity with body mass index (BMI) of 40.0 to 44.9 in adult (HCC)  Mild intermittent asthma without complication -     fluticasone-salmeterol (ADVAIR) 100-50 MCG/ACT AEPB; Inhale 1 puff into the lungs 2 (two) times daily.   PHQ/GAD to goal Refilled paxil and klonapin.   Will get labs today to evaluate sugar/cholesterol etc.   BP elevated in pre-hypertension range Discussed exercise and low salt diet New heart murmur today, asymptomatic Echo ordered HO given  Declined colonoscopy but agreed to cologuard.    Return in about 6 months (around 07/04/2023).    Tandy Gaw, PA-C

## 2023-01-03 NOTE — Patient Instructions (Signed)
Will order echo to evaluate new heart murmur  Heart Murmur A heart murmur is an extra sound that is caused by turbulent blood flow through the valves of the heart. The murmur can be heard as a "hum" or "whoosh" sound when blood flows through the heart. The heart has four areas called chambers. There is a valve for each chamber for the heart. Blood passes through a valve before leaving the chamber. Two of the valves move the blood from the upper chambers of the heart to the lower chambers of the heart (tricuspid valve and mitral valve). The other two valves (aortic valve and pulmonary valve) move the blood to the lungs and the rest of the body. The valves keep blood moving through the heart in the right direction. When the heart valves are not working properly it may cause a murmur. There are two types of heart murmurs: Innocent (benign) murmurs. Most people with this type of heart murmur do not have a heart problem. Many children have innocent heart murmurs. Your health care provider may suggest some basic tests to find out whether your murmur is an innocent murmur. If an innocent heart murmur is found, there is no need for further tests or treatment and no need to restrict activities or stop playing sports. Abnormal murmurs. These types of murmurs can occur in children and adults. Abnormal murmurs may be a sign of a more serious heart condition, such as a heart abnormality present at birth (congenital defect) or heart valve disease. What are the causes? This condition may also be caused by: Pregnancy. Fever. Overactive thyroid gland. Anemia. Exercise. Rapid growth spurts in children. What are the signs or symptoms? Innocent murmurs do not cause symptoms, and many people with abnormal murmurs may not have symptoms. If symptoms do develop, they may include: Shortness of breath or persistent cough. Blue coloring of the skin, especially on the fingertips. Chest pain. Palpitations, or feeling a  fluttering or skipped heartbeat. Fainting. Getting tired much faster than expected. Swelling in the abdomen, feet, or ankles. How is this diagnosed? This condition may be diagnosed during a routine physical or other exam. If your health care provider hears a murmur with a stethoscope, he or she will listen for: Where the murmur is located in your heart. How loud the murmur is. This may help the health care provider figure out what is causing the murmur. You may be referred to a heart specialist (cardiologist). You may also have other tests, including: Electrocardiogram (ECG or EKG). This test measures the electrical activity of your heart. Echocardiogram. This test uses high frequency sound waves to make pictures of your heart. Chest X-ray. Magnetic resonance imaging (MRI). Cardiac catheterization. This test looks at blood flow through the arteries around the heart. For children and adults who have an abnormal heart murmur and want to stay active, it is important to: Complete testing. Review test results. Receive recommendations from your health care provider. If heart disease is present, it may not be safe to play or be involved in activities that require a lot of effort and energy (are strenuous). How is this treated? Heart murmurs themselves do not need treatment. In some cases, a heart murmur may go away on its own. If an underlying problem or disease is causing the murmur, you may need treatment. If treatment is needed, it will depend on the type and severity of the disease or heart problem causing the murmur. Treatment may include: Medicine. Surgery. Changes to your lifestyle and  diet. Follow these instructions at home: Talk with your health care provider before participating in sports or other activities that are strenuous. Learn as much as possible about your condition and any related diseases. Ask your health care provider if you may be at risk for any medical emergencies. Talk  with your health care provider about what symptoms you should look out for. It is up to you to get your test results. Ask your health care provider, or the department that is doing the test, when your results will be ready. Keep all follow-up visits. This is important. Contact a health care provider if: You are frequently short of breath. You feel more tired than usual. You are having a hard time keeping up with normal activities or fitness routines. You have swelling in your ankles or feet. You notice that your heart often beats irregularly. You develop any new symptoms. Get help right away if: You have chest pain. You are having trouble breathing. You feel light-headed or you faint. Your symptoms suddenly get worse. These symptoms may represent a serious problem that is an emergency. Do not wait to see if the symptoms will go away. Get medical help right away. Call your local emergency services (911 in the [place] Do not drive yourself to the hospital. Summary Heart valves keep blood moving through the heart in the right direction. When the valves are not working properly it may cause a murmur. Innocent murmurs do not cause symptoms, and many people with abnormal murmurs may not have symptoms. You may need treatment if an underlying problem or disease is causing the heart murmur. Treatment may include medicine, surgery, and changes to your lifestyle and diet. Talk with your health care provider before participating in sports or other activities that are strenuous. Get help right away if you have chest pain or trouble breathing. This information is not intended to replace advice given to you by your health care provider. Make sure you discuss any questions you have with your health care provider. Document Revised: 04/07/2020 Document Reviewed: 04/07/2020 Elsevier Patient Education  2024 [employer].

## 2023-01-04 LAB — CBC WITH DIFFERENTIAL/PLATELET
Basophils Absolute: 0.1 10*3/uL (ref 0.0–0.2)
Basos: 1 %
EOS (ABSOLUTE): 0.3 10*3/uL (ref 0.0–0.4)
Eos: 4 %
Hematocrit: 38.9 % (ref 34.0–46.6)
Hemoglobin: 12.8 g/dL (ref 11.1–15.9)
Immature Grans (Abs): 0.1 10*3/uL (ref 0.0–0.1)
Immature Granulocytes: 1 %
Lymphocytes Absolute: 2.3 10*3/uL (ref 0.7–3.1)
Lymphs: 26 %
MCH: 28.6 pg (ref 26.6–33.0)
MCHC: 32.9 g/dL (ref 31.5–35.7)
MCV: 87 fL (ref 79–97)
Monocytes Absolute: 0.6 10*3/uL (ref 0.1–0.9)
Monocytes: 7 %
Neutrophils Absolute: 5.5 10*3/uL (ref 1.4–7.0)
Neutrophils: 61 %
Platelets: 332 10*3/uL (ref 150–450)
RBC: 4.47 x10E6/uL (ref 3.77–5.28)
RDW: 12.6 % (ref 11.7–15.4)
WBC: 8.9 10*3/uL (ref 3.4–10.8)

## 2023-01-04 LAB — CMP14+EGFR
ALT: 28 [IU]/L (ref 0–32)
AST: 26 [IU]/L (ref 0–40)
Albumin: 3.8 g/dL — ABNORMAL LOW (ref 3.9–4.9)
Alkaline Phosphatase: 58 [IU]/L (ref 44–121)
BUN/Creatinine Ratio: 13 (ref 9–23)
BUN: 8 mg/dL (ref 6–24)
Bilirubin Total: 0.2 mg/dL (ref 0.0–1.2)
CO2: 24 mmol/L (ref 20–29)
Calcium: 9 mg/dL (ref 8.7–10.2)
Chloride: 101 mmol/L (ref 96–106)
Creatinine, Ser: 0.63 mg/dL (ref 0.57–1.00)
Globulin, Total: 2.4 g/dL (ref 1.5–4.5)
Glucose: 86 mg/dL (ref 70–99)
Potassium: 4.6 mmol/L (ref 3.5–5.2)
Sodium: 138 mmol/L (ref 134–144)
Total Protein: 6.2 g/dL (ref 6.0–8.5)
eGFR: 111 mL/min/{1.73_m2} (ref >59)

## 2023-01-04 LAB — VITAMIN D 25 HYDROXY (VIT D DEFICIENCY, FRACTURES): Vit D, 25-Hydroxy: 59.8 ng/mL (ref 30.0–100.0)

## 2023-01-04 LAB — LIPID PANEL
Chol/HDL Ratio: 3.1 {ratio} (ref 0.0–4.4)
Cholesterol, Total: 173 mg/dL (ref 100–199)
HDL: 55 mg/dL (ref >39)
LDL Chol Calc (NIH): 102 mg/dL — ABNORMAL HIGH (ref 0–99)
Triglycerides: 84 mg/dL (ref 0–149)
VLDL Cholesterol Cal: 16 mg/dL (ref 5–40)

## 2023-01-04 LAB — HEMOGLOBIN A1C
Est. average glucose Bld gHb Est-mCnc: 126 mg/dL
Hgb A1c MFr Bld: 6 % — ABNORMAL HIGH (ref 4.8–5.6)

## 2023-01-04 LAB — TSH: TSH: 2.12 u[IU]/mL (ref 0.450–4.500)

## 2023-01-07 ENCOUNTER — Encounter: Payer: Self-pay | Admitting: Physician Assistant

## 2023-01-07 DIAGNOSIS — R011 Cardiac murmur, unspecified: Secondary | ICD-10-CM | POA: Insufficient documentation

## 2023-01-07 NOTE — Progress Notes (Signed)
Sharell,   HDL, good cholesterol, looks great.  LDL, bad cholesterol, close to optimal.   ..The 10-year ASCVD risk score (Arnett DK, et al., 2019) is: 0.7%   Values used to calculate the score:     Age: 47 years     Sex: Female     Is Non-Hispanic African American: No     Diabetic: No     Tobacco smoker: No     Systolic Blood Pressure: 139 mmHg     Is BP treated: No     HDL Cholesterol: 55 mg/dL     Total Cholesterol: 173 mg/dL  10 year cardiovascular risk is low. GREAT news.   Kidney and liver look good.  Albumin a little low. Make sure getting enough protein in diet. 80g a day!  A1C up some but still in pre-diabetes range. Work on limiting carbs and sugars and getting 150 minutes of exercise a week!  Thyroid and hemoglobin look great.  Vitamin D looks amazing!

## 2023-01-13 ENCOUNTER — Other Ambulatory Visit: Payer: Self-pay | Admitting: Physician Assistant

## 2023-01-13 DIAGNOSIS — E66813 Obesity, class 3: Secondary | ICD-10-CM

## 2023-01-13 DIAGNOSIS — I10 Essential (primary) hypertension: Secondary | ICD-10-CM

## 2023-01-13 DIAGNOSIS — R011 Cardiac murmur, unspecified: Secondary | ICD-10-CM

## 2023-01-13 DIAGNOSIS — J4520 Mild intermittent asthma, uncomplicated: Secondary | ICD-10-CM

## 2023-01-13 DIAGNOSIS — F419 Anxiety disorder, unspecified: Secondary | ICD-10-CM

## 2023-01-13 DIAGNOSIS — R7303 Prediabetes: Secondary | ICD-10-CM

## 2023-01-13 DIAGNOSIS — Z1211 Encounter for screening for malignant neoplasm of colon: Secondary | ICD-10-CM

## 2023-02-07 ENCOUNTER — Ambulatory Visit (HOSPITAL_BASED_OUTPATIENT_CLINIC_OR_DEPARTMENT_OTHER)
Admission: RE | Admit: 2023-02-07 | Discharge: 2023-02-07 | Disposition: A | Discharge status: Home / Self Care | Payer: 59 | Source: Ambulatory Visit | Attending: Physician Assistant | Admitting: Physician Assistant

## 2023-02-07 DIAGNOSIS — R011 Cardiac murmur, unspecified: Secondary | ICD-10-CM | POA: Insufficient documentation

## 2023-02-08 LAB — ECHOCARDIOGRAM COMPLETE
AR max vel: 2.06 cm2
AV Area VTI: 2.03 cm2
AV Area mean vel: 2.16 cm2
AV Mean grad: 7 mm[Hg]
AV Peak grad: 11.8 mm[Hg]
Ao pk vel: 1.72 m/s
Area-P 1/2: 3.99 cm2
Calc EF: 64.3 %
S' Lateral: 2.7 cm
Single Plane A2C EF: 60.1 %
Single Plane A4C EF: 65.7 %

## 2023-02-09 ENCOUNTER — Encounter: Payer: Self-pay | Admitting: Physician Assistant

## 2023-02-09 ENCOUNTER — Other Ambulatory Visit: Payer: Self-pay | Admitting: Physician Assistant

## 2023-02-09 DIAGNOSIS — Q203 Discordant ventriculoarterial connection: Secondary | ICD-10-CM | POA: Insufficient documentation

## 2023-02-09 NOTE — Progress Notes (Signed)
Paula Tucker,   Overall echo looks good but there is a little septum noted in left ventricle that is creating some turbulent blood flow. Ejection fraction is normal. I do want you to see cardiology so they can give any input on follow up.

## 2023-02-09 NOTE — Progress Notes (Signed)
Would you send patient to cardiology for septum with turbulent flow. Asymptomatic. I heard new murmur on exam and got echo.

## 2023-03-06 LAB — COLOGUARD: COLOGUARD: NEGATIVE

## 2023-03-07 ENCOUNTER — Encounter: Payer: Self-pay | Admitting: Physician Assistant

## 2023-03-07 NOTE — Progress Notes (Signed)
Negative cologuard. Re-screen in 3 years.

## 2023-03-18 ENCOUNTER — Other Ambulatory Visit: Payer: Self-pay

## 2023-03-18 DIAGNOSIS — Q203 Discordant ventriculoarterial connection: Secondary | ICD-10-CM

## 2023-04-26 ENCOUNTER — Other Ambulatory Visit: Payer: Self-pay | Admitting: Physician Assistant

## 2023-04-26 DIAGNOSIS — Z1231 Encounter for screening mammogram for malignant neoplasm of breast: Secondary | ICD-10-CM

## 2023-04-27 ENCOUNTER — Ambulatory Visit

## 2023-04-27 DIAGNOSIS — Z1231 Encounter for screening mammogram for malignant neoplasm of breast: Secondary | ICD-10-CM

## 2023-05-02 ENCOUNTER — Encounter: Payer: Self-pay | Admitting: Physician Assistant

## 2023-05-02 NOTE — Progress Notes (Signed)
 Normal mammogram. Follow up in 1 year.

## 2023-06-30 NOTE — Progress Notes (Unsigned)
 Referring-Paula Breeback PA-C Reason for referral-abnormal echocardiogram  HPI: 48 year old female for evaluation of abnormal echocardiogram at request of Sandy Crumb PA-C.  Echocardiogram performed for murmur January 2025 showed normal LV function, turbulent flow across the LVOT, sigmoid septum.  Cardiology now asked to evaluate.  Current Outpatient Medications  Medication Sig Dispense Refill   albuterol  (VENTOLIN  HFA) 108 (90 Base) MCG/ACT inhaler Inhale 2 puffs into the lungs every 6 (six) hours as needed for wheezing. 2 each 11   BUDESONIDE NA ADD OF MEDICATION TO 240ML OF SALINE IN SALINE IRRIGATION BOTTLE; IRRIGATE SINUSES WITH THROUGH EACH NOSTRIL TWICE DAILY     clonazePAM  (KLONOPIN ) 0.5 MG tablet Take 1 tablet (0.5 mg total) by mouth 2 (two) times daily. 60 tablet 5   fluticasone -salmeterol (ADVAIR) 100-50 MCG/ACT AEPB Inhale 1 puff into the lungs 2 (two) times daily. 60 each 3   PARoxetine  (PAXIL ) 20 MG tablet Take 1 tablet (20 mg total) by mouth daily. 90 tablet 1   No current facility-administered medications for this visit.    Allergies  Allergen Reactions   Topamax  [Topiramate ]     Numbness on one side of face.      Past Medical History:  Diagnosis Date   Anxiety    Asthma     Past Surgical History:  Procedure Laterality Date   cyst on neck      Social History   Socioeconomic History   Marital status: Married    Spouse name: Not on file   Number of children: Not on file   Years of education: Not on file   Highest education level: Master's degree (e.g., MA, MS, MEng, MEd, MSW, MBA)  Occupational History   Not on file  Tobacco Use   Smoking status: Never   Smokeless tobacco: Never  Vaping Use   Vaping status: Never Used  Substance and Sexual Activity   Alcohol use: No   Drug use: No   Sexual activity: Yes    Birth control/protection: Pill  Other Topics Concern   Not on file  Social History Narrative   Right handed    Lives with  husband and 2 dogs   Two story home   Caffeine 1 cup every other day   Teacher    Social Drivers of Health   Financial Resource Strain: Low Risk  (01/02/2023)   Overall Financial Resource Strain (CARDIA)    Difficulty of Paying Living Expenses: Not hard at all  Food Insecurity: No Food Insecurity (01/02/2023)   Hunger Vital Sign    Worried About Running Out of Food in the Last Year: Never true    Ran Out of Food in the Last Year: Never true  Transportation Needs: No Transportation Needs (01/02/2023)   PRAPARE - Administrator, Civil Service (Medical): No    Lack of Transportation (Non-Medical): No  Physical Activity: Insufficiently Active (01/02/2023)   Exercise Vital Sign    Days of Exercise per Week: 2 days    Minutes of Exercise per Session: 20 min  Stress: No Stress Concern Present (01/02/2023)   [employer] of Occupational Health - Occupational Stress Questionnaire    Feeling of Stress : Only a little  Social Connections: Unknown (01/02/2023)   Social Connection and Isolation Panel    Frequency of Communication with Friends and Family: Twice a week    Frequency of Social Gatherings with Friends and Family: Not on file    Attends Religious Services: Not on file  Active Member of Clubs or Organizations: Yes    Attends Banker Meetings: More than 4 times per year    Marital Status: Married  Intimate Partner Violence: Unknown (01/20/2022)   Received from [facility]   HITS    Physically Hurt: Not on file    Insult or Talk Down To: Not on file    Threaten Physical Harm: Not on file    Scream or Curse: Not on file    Family History  Problem Relation Age of Onset   Heart failure Mother    Depression Father    Diabetes Father    Diabetes Maternal Grandmother    Cancer Maternal Grandfather        Melanoma   Diabetes Maternal Grandfather    Stroke Maternal Grandfather    Cancer Maternal Aunt        Melanoma   Depression Maternal Aunt     Cancer Paternal Aunt        Breast Cancer    ROS: no fevers or chills, productive cough, hemoptysis, dysphasia, odynophagia, melena, hematochezia, dysuria, hematuria, rash, seizure activity, orthopnea, PND, pedal edema, claudication. Remaining systems are negative.  Physical Exam:   There were no vitals taken for this visit.  General:  Well developed/well nourished in NAD Skin warm/dry Patient not depressed No peripheral clubbing Back-normal HEENT-normal/normal eyelids Neck supple/normal carotid upstroke bilaterally; no bruits; no JVD; no thyromegaly chest - CTA/ normal expansion CV - RRR/normal S1 and S2; no murmurs, rubs or gallops;  PMI nondisplaced Abdomen -NT/ND, no HSM, no mass, + bowel sounds, no bruit 2+ femoral pulses, no bruits Ext-no edema, chords, 2+ DP Neuro-grossly nonfocal  ECG - personally reviewed  A/P  1 abnormal echocardiogram-  Alexandria Angel, MD

## 2023-07-04 ENCOUNTER — Ambulatory Visit: Payer: BC Managed Care – PPO | Admitting: Physician Assistant

## 2023-07-04 ENCOUNTER — Other Ambulatory Visit: Payer: Self-pay | Admitting: Physician Assistant

## 2023-07-04 ENCOUNTER — Encounter: Payer: Self-pay | Admitting: Physician Assistant

## 2023-07-04 VITALS — BP 139/77 | HR 99 | Ht 61.0 in | Wt 213.0 lb

## 2023-07-04 DIAGNOSIS — F32A Depression, unspecified: Secondary | ICD-10-CM

## 2023-07-04 DIAGNOSIS — I10 Essential (primary) hypertension: Secondary | ICD-10-CM

## 2023-07-04 DIAGNOSIS — F419 Anxiety disorder, unspecified: Secondary | ICD-10-CM

## 2023-07-04 DIAGNOSIS — R7303 Prediabetes: Secondary | ICD-10-CM | POA: Diagnosis not present

## 2023-07-04 DIAGNOSIS — J4520 Mild intermittent asthma, uncomplicated: Secondary | ICD-10-CM

## 2023-07-04 DIAGNOSIS — Z79899 Other long term (current) drug therapy: Secondary | ICD-10-CM

## 2023-07-04 LAB — POCT GLYCOSYLATED HEMOGLOBIN (HGB A1C): Hemoglobin A1C: 5.5 % (ref 4.0–5.6)

## 2023-07-04 MED ORDER — PAROXETINE HCL 20 MG PO TABS: 20.0000 mg | ORAL_TABLET | Freq: Every day | ORAL | 4 refills | Status: AC

## 2023-07-04 MED ORDER — AIRSUPRA 90-80 MCG/ACT IN AERO: 2.0000 | INHALATION_SPRAY | Freq: Four times a day (QID) | RESPIRATORY_TRACT | 1 refills | Status: AC | PRN

## 2023-07-04 MED ORDER — CLONAZEPAM 0.5 MG PO TABS: 0.5000 mg | ORAL_TABLET | Freq: Two times a day (BID) | ORAL | 5 refills | Status: DC

## 2023-07-04 MED ORDER — FLUTICASONE-SALMETEROL 100-50 MCG/ACT IN AEPB: 1.0000 | INHALATION_SPRAY | Freq: Two times a day (BID) | RESPIRATORY_TRACT | 3 refills | Status: DC

## 2023-07-04 NOTE — Patient Instructions (Addendum)
 Airsupra to replace albuterol  Continue advair/paxil /klonapin  Continue to work on diet and exercise. GREAT A1c today at 5.5.   Warm compresses and topical abx cream for sore on left inner arm.

## 2023-07-04 NOTE — Progress Notes (Unsigned)
 Established Patient Office Visit  Subjective   Patient ID: Paula Tucker, female    DOB: 07-05-1975  Age: 48 y.o. MRN: [id number]  Chief Complaint  Patient presents with   Medical Management of Chronic Issues    Last A1c 6.0, med refills, phq-gad    HPI Pt is a 48 yo obese female with Asthma, Pre-diabetes, HTN, GAD, Depression who presents to the clinic for 6 month follow up.   Pt is doing well. She denies any concerns with mood. She denies any SI/HC. She is compliant with medications.   She is not having any SOB, cough. She uses advair daily and not had to use albuterol  for rescue. She denies any CP or palpitations. She does not check her BP at home.   She is trying to keep low sugar/carb diet. She is not exercising regularly. She is very active overall with work and life.    Review of Systems  All other systems reviewed and are negative.     Objective:     BP 139/77   Pulse 99   Ht 5' 1 (1.549 m)   Wt 213 lb (96.6 kg)   SpO2 99%   BMI 40.25 kg/m  BP Readings from Last 3 Encounters:  07/04/23 139/77  01/03/23 139/73  11/22/22 119/83   Wt Readings from Last 3 Encounters:  07/04/23 213 lb (96.6 kg)  01/03/23 213 lb (96.6 kg)  11/22/22 206 lb (93.4 kg)      Physical Exam Constitutional:      Appearance: Normal appearance. She is obese.  HENT:     Head: Normocephalic.   Cardiovascular:     Rate and Rhythm: Normal rate and regular rhythm.     Heart sounds: Murmur heard.  Pulmonary:     Effort: Pulmonary effort is normal.   Neurological:     General: No focal deficit present.     Mental Status: She is alert and oriented to person, place, and time.   Psychiatric:        Mood and Affect: Mood normal.    ..    07/04/2023    3:28 PM 01/07/2023    6:22 AM 07/02/2022    3:25 PM 07/02/2022    3:24 PM 12/30/2021   11:34 AM  Depression screen PHQ 2/9  Decreased Interest 1 0 1 1 1   Down, Depressed, Hopeless 1 0 1 1 1   PHQ - 2 Score 2 0 2 2 2   Altered  sleeping 1 0 1  1  Tired, decreased energy 1 1 1  1   Change in appetite 0 0 0  0  Feeling bad or failure about yourself  0 0 0  0  Trouble concentrating 0 0 0  0  Moving slowly or fidgety/restless 0 0 0  0  Suicidal thoughts 0 0 0  0  PHQ-9 Score 4 1 4  4   Difficult doing work/chores Not difficult at all Not difficult at all Somewhat difficult  Not difficult at all   ..    07/04/2023    3:28 PM 01/07/2023    6:22 AM 07/02/2022    3:26 PM 12/30/2021   11:34 AM  GAD 7 : Generalized Anxiety Score  Nervous, Anxious, on Edge 0 1 1 1   Control/stop worrying 0 0 0 0  Worry too much - different things 0 1 0 1  Trouble relaxing 0 0 1 0  Restless 0 0 0 0  Easily annoyed or irritable 1 1 1  1  Afraid - awful might happen 0 0 0 0  Total GAD 7 Score 1 3 3 3   Anxiety Difficulty Not difficult at all Not difficult at all Somewhat difficult Somewhat difficult      .SABRA Results for orders placed or performed in visit on 07/04/23  POCT HgB A1C   Collection Time: 07/04/23  3:25 PM  Result Value Ref Range   Hemoglobin A1C 5.5 4.0 - 5.6 %   HbA1c POC (<> result, manual entry)     HbA1c, POC (prediabetic range)     HbA1c, POC (controlled diabetic range)      The 10-year ASCVD risk score (Arnett DK, et al., 2019) is: 0.7%    Assessment & Plan:  .SABRAAlethia was seen today for medical management of chronic issues.  Diagnoses and all orders for this visit:  Pre-diabetes -     POCT HgB A1C  Hypertension, unspecified type  Medication management  Anxiety and depression -     clonazePAM  (KLONOPIN ) 0.5 MG tablet; Take 1 tablet (0.5 mg total) by mouth 2 (two) times daily. -     PARoxetine  (PAXIL ) 20 MG tablet; Take 1 tablet (20 mg total) by mouth daily.  Mild intermittent asthma without complication -     Albuterol -Budesonide (AIRSUPRA) 90-80 MCG/ACT AERO; Inhale 2 puffs into the lungs every 6 (six) hours as needed. -     fluticasone -salmeterol (ADVAIR) 100-50 MCG/ACT AEPB; Inhale 1 puff into the  lungs 2 (two) times daily.   PHQ/GAD stable and to goal Paxil  and klonapin refilled today .PDMP reviewed during this encounter. No concerns  Advair refilled for asthma control Replaced albuterol  with airsupra for better rescue and prevention of exacerbation data Coupon card given today  A1C improved and out of pre-diabetes range Continue with low sugar/carb diet and regular exercise  Pt has appt with cardiology due to echo that showed some turbulent flow and heart murmur. BP today under 140/90  Follow up in 6 months   Return in about 6 months (around 01/03/2024).    Wynter Grave, PA-C

## 2023-07-04 NOTE — Telephone Encounter (Signed)
 Please advise on refill request

## 2023-07-05 ENCOUNTER — Encounter: Payer: Self-pay | Admitting: Physician Assistant

## 2023-07-06 ENCOUNTER — Ambulatory Visit: Admitting: Cardiology

## 2023-07-06 ENCOUNTER — Encounter: Payer: Self-pay | Admitting: Cardiology

## 2023-07-06 VITALS — BP 120/62 | HR 84 | Ht 61.0 in | Wt 211.0 lb

## 2023-07-06 DIAGNOSIS — R011 Cardiac murmur, unspecified: Secondary | ICD-10-CM | POA: Diagnosis not present

## 2023-07-06 DIAGNOSIS — R931 Abnormal findings on diagnostic imaging of heart and coronary circulation: Secondary | ICD-10-CM

## 2023-07-06 NOTE — Patient Instructions (Signed)
  Follow-Up: At [facility], you and your health needs are our priority.  As part of our continuing mission to provide you with exceptional heart care, our providers are all part of one team.  This team includes your primary Cardiologist (physician) and Advanced Practice Providers or APPs (Physician Assistants and Nurse Practitioners) who all work together to provide you with the care you need, when you need it.  Your next appointment:    As needed

## 2023-08-04 ENCOUNTER — Ambulatory Visit: Admitting: Obstetrics and Gynecology

## 2023-08-04 ENCOUNTER — Encounter: Payer: Self-pay | Admitting: Obstetrics and Gynecology

## 2023-08-04 ENCOUNTER — Other Ambulatory Visit (HOSPITAL_COMMUNITY)
Admission: RE | Admit: 2023-08-04 | Discharge: 2023-08-04 | Disposition: A | Discharge status: Home / Self Care | Source: Ambulatory Visit | Attending: Obstetrics and Gynecology | Admitting: Obstetrics and Gynecology

## 2023-08-04 VITALS — BP 119/79 | HR 80 | Ht 61.0 in | Wt 212.0 lb

## 2023-08-04 DIAGNOSIS — Z1331 Encounter for screening for depression: Secondary | ICD-10-CM

## 2023-08-04 DIAGNOSIS — Z01419 Encounter for gynecological examination (general) (routine) without abnormal findings: Secondary | ICD-10-CM

## 2023-08-04 DIAGNOSIS — R1900 Intra-abdominal and pelvic swelling, mass and lump, unspecified site: Secondary | ICD-10-CM | POA: Diagnosis not present

## 2023-08-04 NOTE — Addendum Note (Signed)
 Addended by: ELODIE NEST T on: 08/04/2023 02:31 PM   Modules accepted: Orders

## 2023-08-04 NOTE — Patient Instructions (Signed)
 It was nice meeting you today. You will see your results in the MyChart app within 1 week.

## 2023-08-04 NOTE — Progress Notes (Signed)
 ANNUAL EXAM Patient name: Paula Tucker MRN [id number]  Date of birth: Mar 20, 1975 Chief Complaint:   New Patient (Initial Visit) (New GYN for annual- Reports no issues )  History of Present Illness:   Paula Tucker is a 48 y.o. No obstetric history on file. with Patient's last menstrual period was 07/15/2023. being seen today for a routine annual exam.  Current complaints: Cycles are regular, getting heavier. Otherwise doing well  Last pap 11/03/18. Results were: NILM w/ HRHPV negative. H/O abnormal pap: no Last mammogram: 04/27/23. Results were: normal Last colonoscopy: negative cologuard 02/21/23 HPV vaccine: unsure, but thinks she's received it     08/04/2023    1:42 PM 07/04/2023    3:28 PM 01/07/2023    6:22 AM 07/02/2022    3:25 PM 07/02/2022    3:24 PM  Depression screen PHQ 2/9  Decreased Interest 1 1 0 1 1  Down, Depressed, Hopeless 0 1 0 1 1  PHQ - 2 Score 1 2 0 2 2  Altered sleeping 0 1 0 1   Tired, decreased energy 1 1 1 1    Change in appetite 0 0 0 0   Feeling bad or failure about yourself  0 0 0 0   Trouble concentrating 0 0 0 0   Moving slowly or fidgety/restless 0 0 0 0   Suicidal thoughts 0 0 0 0   PHQ-9 Score 2 4 1 4    Difficult doing work/chores  Not difficult at all Not difficult at all Somewhat difficult         08/04/2023    1:42 PM 07/04/2023    3:28 PM 01/07/2023    6:22 AM 07/02/2022    3:26 PM  GAD 7 : Generalized Anxiety Score  Nervous, Anxious, on Edge 1 0 1 1  Control/stop worrying 0 0 0 0  Worry too much - different things 0 0 1 0  Trouble relaxing 0 0 0 1  Restless 0 0 0 0  Easily annoyed or irritable 1 1 1 1   Afraid - awful might happen 0 0 0 0  Total GAD 7 Score 2 1 3 3   Anxiety Difficulty  Not difficult at all Not difficult at all Somewhat difficult     Review of Systems:   Pertinent items are noted in HPI Denies any headaches, blurred vision, fatigue, shortness of breath, chest pain, abdominal pain, abnormal vaginal  discharge/itching/odor/irritation, problems with periods, bowel movements, urination, or intercourse unless otherwise stated above. Pertinent History Reviewed:  Reviewed past medical,surgical, social and family history.  Reviewed problem list, medications and allergies. Physical Assessment:   Vitals:   08/04/23 1329 08/04/23 1335  BP: (!) 136/91 119/79  Pulse: 80 80  Weight: 212 lb (96.2 kg)   Height: 5' 1 (1.549 m)   Body mass index is 40.06 kg/m.        Physical Examination:   General appearance - well appearing, and in no distress  Mental status - alert, oriented to person, place, and time  Chest - respiratory effort normal  Heart - normal peripheral perfusion  Breasts - breasts appear normal, no suspicious masses, no skin or nipple changes or axillary nodes  Abdomen - soft, nontender, nondistended, no masses or organomegaly  Pelvic - VULVA: normal appearing vulva with no masses, tenderness or lesions  VAGINA: normal appearing vagina with normal color and discharge, no lesions  CERVIX: not visualized. Blind pap collected  UTERUS: Enlarged, firm mass palpates like a fibroid, mobile, fills midline pelvis  Chaperone present for exam  No results found for this or any previous visit (from the past 24 hours).  Assessment & Plan:  1) Well-Woman Exam Mammogram: up to date Colonoscopy: up to date on cologuard Pap: Blind pap collected today Gardasil: Likely received, declines today  Labs/procedures today:   Orders Placed This Encounter  Procedures   US  PELVIC COMPLETE WITH TRANSVAGINAL    Meds: No orders of the defined types were placed in this encounter.  Follow-up: Return for pending utlrasound results.  Kieth JAYSON Carolin, MD 08/04/2023 2:02 PM

## 2023-08-05 ENCOUNTER — Ambulatory Visit

## 2023-08-05 DIAGNOSIS — R1900 Intra-abdominal and pelvic swelling, mass and lump, unspecified site: Secondary | ICD-10-CM | POA: Diagnosis not present

## 2023-08-10 LAB — CYTOLOGY - PAP
Adequacy: ABSENT
Comment: NEGATIVE
Diagnosis: NEGATIVE
High risk HPV: NEGATIVE

## 2023-08-12 ENCOUNTER — Ambulatory Visit: Payer: Self-pay | Admitting: Obstetrics and Gynecology

## 2023-09-20 NOTE — Progress Notes (Unsigned)
   RETURN GYNECOLOGY VISIT  Subjective:  Paula Tucker is a 48 y.o. No obstetric history on file. With heavy periods and new diagnosis of fibroids presenting for follow up  Pelvic US  08/05/23 8.6 x 5.8 x 7.8cm fibroid  Menstrual Hx:  PMH: PSH: Meds: All: OB: Pap Hx:  Soc: FHx:  Objective:  There were no vitals filed for this visit.  General:  Alert, oriented and cooperative. Patient is in no acute distress.  Skin: Skin is warm and dry. No rash noted.   Cardiovascular: Normal heart rate noted  Respiratory: Normal respiratory effort, no problems with respiration noted  Abdomen: Soft, non-tender, non-distended   Pelvic: NEFG.   Exam performed in the presence of a chaperone  Assessment and Plan:  Paula Tucker is a 48 y.o. with ***  There are no diagnoses linked to this encounter.  No follow-ups on file.  Future Appointments  Date Time Provider Department Center  09/22/2023  3:30 PM Erik Kieth BROCKS, MD [facility]  01/10/2024  3:00 PM Antoniette Vermell CROME, PA-C [facility] Kieth BROCKS Erik, MD

## 2023-09-22 ENCOUNTER — Ambulatory Visit: Admitting: Obstetrics and Gynecology

## 2023-09-22 ENCOUNTER — Encounter: Payer: Self-pay | Admitting: Obstetrics and Gynecology

## 2023-09-22 VITALS — BP 112/81 | HR 82 | Ht 61.0 in | Wt 212.0 lb

## 2023-09-22 DIAGNOSIS — D219 Benign neoplasm of connective and other soft tissue, unspecified: Secondary | ICD-10-CM | POA: Diagnosis not present

## 2023-09-22 MED ORDER — TRANEXAMIC ACID 650 MG PO TABS: 1300.0000 mg | ORAL_TABLET | Freq: Three times a day (TID) | ORAL | 2 refills | Status: AC

## 2023-09-22 NOTE — Patient Instructions (Signed)
 What is bladder training?  Bladder training means changing your habits to better control your bladder. It can help with urinary problems like:  ?Frequency - This means having to use the toilet very frequently.  ?Urgency - This means suddenly feeling the need to urinate right away.  ?Leakage - This is when urgency leads to actually leaking urine. It is also called urge incontinence.  What is a bladder diary?  Bladder training involves trying to increase the amount of time between trips to the toilet. A bladder diary is a way to keep track of how often you go (form 1). Doctors sometimes call this a voiding diary.  In the diary, write down:  ?The time you urinate  ?The amount of urine, if your doctor asked you to measure this  ?If you had any leaking, how much (small, medium, or large amount), and what you were doing when the leakage happened  ?The amounts and types of fluids you drink  ?Any other symptoms you have   How do I train my bladder?  Once you have an idea of how often you are urinating, try to wait a little longer between trips to the toilet. This should be a gradual process:  ?First, slightly increase the amount of time between bathroom visits. For example, if you normally go every hour, add 15 minutes. This means trying to wait 1 hour and 15 minutes between trips to the bathroom.  ?Keep following this schedule for several days. If you can do this for 3 or 4 days without problems, increase the time again. For example, you can add 15 more minutes between trips to the bathroom.  ?Keep doing this until you can wait at least 2 to 3 hours between bathroom visits. It can take time to get to this point. Some people notice improvement within a few weeks of bladder training. But it can take longer.  Keep filling out your bladder diary as you work on bladder training. This will help you see improvement over time. The process might take several weeks.   What else can I do  to help with urgency?  Part of bladder training involves learning to control the urge to urinate and make your bladder wait. Some things you can do to help with this:  ?Squeeze your pelvic muscles - These include the muscles that control the flow of urine. Try squeezing the muscles and then relaxing them. Repeat this several times.  ?Change positions - It might help to cross your legs or sit on a firm surface.  ?Distract your mind - Try to think about something else until the urge passes.  ?Relax - Stay still when you get the urge to urinate. It might help to do deep breathing exercises. Do not rush to the toilet when it is time to go.   What else should I know?  These tips might help with bladder training:  ?Try to only urinate when you actually need to go - For example, avoid going to the bathroom before leaving home just in case. This can train your brain to think that your bladder is full when it really isn't.  ?Avoid drinking fluids soon before bedtime - This can lower the chances that you will need to urinate during the night.  ?Limit or avoid alcohol and caffeine - Some people find that these things make them need to urinate more.  When should I call the doctor?  Call your doctor or nurse if:  ?Your urinary symptoms are  not getting better or are getting worse.  ?You are having trouble with your training schedule - Bladder training can be frustrating and take time. But don't give up. Your doctor or nurse can help you.  ?You have other problems with urinating, such as pain or burning, not being able to urinate, or seeing blood in your urine.    FOODS TO AVOID IF YOU HAVE AN OVERACTIVE BLADDER Alcoholic beverages (liquor, beer, wine) Brewer's Yeast Carbonated beverages (soda, seltzer water) Sports Drinks Tea Milk/milk products Sugar & artificial sweeteners Coffee (even decaffeinated) Tea - black or green, regular or decaffeinated Honey Medicines with  caffeine Chocolate Tomatoes and tomato-based products Citrus juice & fruits Corn Syrup Highly spiced foods/chilies Raw onion Saccharin Sour cream Soy sauce Strawberries Vinegar Vitamins buffered with aspartame  OVERACTIVE BLADDER DIET BETTER FOODS TO INCORPORATE Lean Proteins - fish, chicken breast, [place], low-fat beef, and pork are good options. Eggs are also a good source of protein if you're trying to avoid meat. Fiber-Rich Foods - these foods are filling and can help prevent constipation, which can put extra pressure on your bladder. Almonds, oats, pears, raspberries lentils, and beans are all good options when you want to add more fiber to your diet. Fruits - while some fruits, especially citrus, can irritate the bladder, it's still important to incorporate them into your diet. Bananas, apples, grapes, coconut, and watermelon are good options for those with overactive bladder. Vegetables - Leafy greens, like kale, lettuce, cucumber, squash, potatoes, broccoli, carrots, celery and bell peppers. Nuts Whole grains, like oats, barley, farro, and quinoa (also a great protein). You may wish to eliminate all the foods on the "do not eat" list, then slowly reintroduce them back into your diet one by one to determine which ones your bladder finds irritating.

## 2023-12-14 ENCOUNTER — Ambulatory Visit: Admitting: Obstetrics and Gynecology

## 2023-12-14 ENCOUNTER — Telehealth: Payer: Self-pay | Admitting: *Deleted

## 2023-12-14 VITALS — BP 116/81 | HR 79 | Wt 212.0 lb

## 2023-12-14 DIAGNOSIS — L292 Pruritus vulvae: Secondary | ICD-10-CM | POA: Diagnosis not present

## 2023-12-14 DIAGNOSIS — R4586 Emotional lability: Secondary | ICD-10-CM | POA: Diagnosis not present

## 2023-12-14 DIAGNOSIS — D219 Benign neoplasm of connective and other soft tissue, unspecified: Secondary | ICD-10-CM

## 2023-12-14 MED ORDER — TRIAMCINOLONE ACETONIDE 0.5 % EX OINT: 1.0000 | TOPICAL_OINTMENT | Freq: Every evening | CUTANEOUS | 0 refills | Status: AC

## 2023-12-14 NOTE — Patient Instructions (Signed)
 Avoid: - Synthetic underwear - Tight pants - Swim suits, thongs, leotards, leggings for prolonged periods of time - Scented soap/shampoo - Bubble baths - Scented detergents, dryer sheets - Baby wipes - Feminine sprays, douches, powders - Panty liners - Dyed toilet paper - Shaving  Trying swapping out the above for: - Cotton or no underwear - Loose pants, skirts, dresses - Changing out of swimwear, thongs, and workout gear as soon as you're done exercising - Fragrance free soaps (like Dove sensitive skin) - Warm plain water baths - Unscented laundry detergent - Use a bedet or peri bottle to rinse instead of baby wipes - Tampons, cotton pads, cotton period underwear - Undyed toilet paper - Clipping hair

## 2023-12-14 NOTE — Telephone Encounter (Signed)
 Left patient a message that she can come at 4:30 PM if she wants to.

## 2023-12-14 NOTE — Progress Notes (Signed)
   RETURN GYNECOLOGY VISIT  Subjective:  Paula Tucker is a 48 y.o. G0P0000 with LMP 11/24/23 presenting for follow up of fibroids  Has 8.6cm right/anterior fibroid. Has regular but heavy cycles. Last saw patient 09/2023 and started lysteda  for bleeding to try a low risk option for management. She also had issues with urinary frequency but this has improved and is no longer a concern.   Today, reports bleeding got a little better with lysteda . Her main concern today is significant vulvar itching and swelling that happened after her menstrual cycle. Also notices it after sitting for prolonged periods of time. Not currently itching. Has tried vagisil without relief.   She also had mood changes/PMS-type symptoms for the first time with her last cycle. Is wondering if peri-menopause could be contributing. Starting to get more acne. No hot flashes  Objective:   Vitals:   12/14/23 1625  BP: 116/81  Pulse: 79  Weight: 212 lb 0.6 oz (96.2 kg)   General:  Alert, oriented and cooperative. Patient is in no acute distress.  Skin: Skin is warm and dry. No rash noted.   Cardiovascular: Normal heart rate noted  Respiratory: Normal respiratory effort, no problems with respiration noted   Assessment and Plan:  Paula Tucker is a 48 y.o. with fibroids, vulvar itching  Fibroids Bleeding improved w/ lysteda  but not resolved She is thinking she will ultimately want a hysterectomy but would like to see how cycles evolve over the next few months.  Vulvar itching Vulvar care reviewed May be related to irritant in her environment or irritation from pads during her cycle Discussed emollient during cycle to protect skin Will also send rx for topical steroid to use if symptoms do not improve with emollient alone. Discussed max use of 14 days. If persistent symptoms or skin changes will need further exams -     triamcinolone ointment (KENALOG) 0.5 %; Apply 1 Application topically at bedtime for 14 days. For  itching  Mood changes Happened with one cycle, plan for expectant management for now. May be related to menopause transition We also discussed luteal phase SSRI, drospirenone COC/drospirenone has the potential benefit of helping with mood, bleeding, and acne concerns but she would like to avoid this for now  Return in about 6 months (around 06/13/2024) for follow up fibroids, itching.  Future Appointments  Date Time Provider Department Center  01/13/2024  3:20 PM Breeback, [name] L, PA-C [facility] Kieth JAYSON Erik, MD

## 2024-01-10 ENCOUNTER — Ambulatory Visit: Admitting: Physician Assistant

## 2024-01-13 ENCOUNTER — Encounter: Payer: Self-pay | Admitting: Physician Assistant

## 2024-01-13 ENCOUNTER — Ambulatory Visit: Admitting: Physician Assistant

## 2024-01-13 VITALS — BP 134/67 | HR 67 | Ht 61.0 in | Wt 214.0 lb

## 2024-01-13 DIAGNOSIS — Z79899 Other long term (current) drug therapy: Secondary | ICD-10-CM | POA: Diagnosis not present

## 2024-01-13 DIAGNOSIS — F419 Anxiety disorder, unspecified: Secondary | ICD-10-CM | POA: Diagnosis not present

## 2024-01-13 DIAGNOSIS — Z87898 Personal history of other specified conditions: Secondary | ICD-10-CM

## 2024-01-13 DIAGNOSIS — F32A Depression, unspecified: Secondary | ICD-10-CM | POA: Diagnosis not present

## 2024-01-13 DIAGNOSIS — I10 Essential (primary) hypertension: Secondary | ICD-10-CM | POA: Diagnosis not present

## 2024-01-13 DIAGNOSIS — Z131 Encounter for screening for diabetes mellitus: Secondary | ICD-10-CM | POA: Diagnosis not present

## 2024-01-13 DIAGNOSIS — J4520 Mild intermittent asthma, uncomplicated: Secondary | ICD-10-CM | POA: Diagnosis not present

## 2024-01-13 DIAGNOSIS — Z13220 Encounter for screening for lipoid disorders: Secondary | ICD-10-CM

## 2024-01-13 DIAGNOSIS — D259 Leiomyoma of uterus, unspecified: Secondary | ICD-10-CM

## 2024-01-13 LAB — POCT GLYCOSYLATED HEMOGLOBIN (HGB A1C): Hemoglobin A1C: 5.5 % (ref 4.0–5.6)

## 2024-01-13 MED ORDER — FLUTICASONE-SALMETEROL 100-50 MCG/ACT IN AEPB: 1.0000 | INHALATION_SPRAY | Freq: Two times a day (BID) | RESPIRATORY_TRACT | 4 refills | Status: AC

## 2024-01-13 MED ORDER — CLONAZEPAM 0.5 MG PO TABS: 0.5000 mg | ORAL_TABLET | Freq: Two times a day (BID) | ORAL | 5 refills | Status: AC

## 2024-01-13 NOTE — Progress Notes (Signed)
 "  Established Patient Office Visit  Subjective   Patient ID: Paula Tucker, female    DOB: 11/09/1975  Age: 49 y.o. MRN: [id number]  Chief Complaint  Patient presents with   Medical Management of Chronic Issues    HPI .Discussed the use of AI scribe software for clinical note transcription with the patient, who gave verbal consent to proceed.  History of Present Illness Paula Tucker is a 49 year old female who presents for follow-up on GAD, Asthma, Pre-diabetes.   Abnormal uterine bleeding and uterine fibroids - Cervix not visualized during recent Pap test, prompting further evaluation - Internal and external ultrasounds in early summer identified two uterine fibroids: one the size of a grapefruit, the other the size of a peach - No surgical intervention pursued to date - Experiencing changes in menstrual bleeding patterns  Psychiatric symptoms and medication management - Anxiety managed with Paxil  and Klonopin  - No recent changes in psychiatric medication regimen  Asthma management - Asthma controlled with Advair - No shortness of breath - Rady's Zepbound available as needed  Metabolic and cardiovascular health - Hemoglobin A1c stable at 5.5 - Blood pressure well-controlled  Physical activity and functional status - Exercise limited during busy fall season - Increased physical activity since school break and intends to maintain this improvement - Wears Crocs with fur lining to keep feet warm during outdoor school duties     ROS See HPI.    Objective:     BP 134/67   Pulse 67   Ht 5' 1 (1.549 m)   Wt 214 lb (97.1 kg)   LMP 11/24/2023 (Exact Date)   SpO2 98%   BMI 40.43 kg/m  BP Readings from Last 3 Encounters:  01/13/24 134/67  12/14/23 116/81  09/22/23 112/81   Wt Readings from Last 3 Encounters:  01/13/24 214 lb (97.1 kg)  12/14/23 212 lb 0.6 oz (96.2 kg)  09/22/23 212 lb (96.2 kg)   .SABRA Results for orders placed or performed in visit on 01/13/24   POCT HgB A1C   Collection Time: 01/13/24  3:13 PM  Result Value Ref Range   Hemoglobin A1C 5.5 4.0 - 5.6 %   HbA1c POC (<> result, manual entry)     HbA1c, POC (prediabetic range)     HbA1c, POC (controlled diabetic range)      ..    01/13/2024    3:22 PM 08/04/2023    1:42 PM 07/04/2023    3:28 PM 01/07/2023    6:22 AM 07/02/2022    3:25 PM  Depression screen PHQ 2/9  Decreased Interest 1 1 1  0 1  Down, Depressed, Hopeless 1 0 1 0 1  PHQ - 2 Score 2 1 2  0 2  Altered sleeping 0 0 1 0 1  Tired, decreased energy 2 1 1 1 1   Change in appetite 0 0 0 0 0  Feeling bad or failure about yourself  0 0 0 0 0  Trouble concentrating 0 0 0 0 0  Moving slowly or fidgety/restless 0 0 0 0 0  Suicidal thoughts 0 0 0 0 0  PHQ-9 Score 4 2  4  1  4    Difficult doing work/chores Not difficult at all  Not difficult at all Not difficult at all Somewhat difficult     Data saved with a previous flowsheet row definition   ..    01/13/2024    3:22 PM 08/04/2023    1:42 PM 07/04/2023    3:28 PM  01/07/2023    6:22 AM  GAD 7 : Generalized Anxiety Score  Nervous, Anxious, on Edge 1 1 0 1  Control/stop worrying 0 0 0 0  Worry too much - different things 0 0 0 1  Trouble relaxing 1 0 0 0  Restless 0 0 0 0  Easily annoyed or irritable 2 1 1 1   Afraid - awful might happen 0 0 0 0  Total GAD 7 Score 4 2 1 3   Anxiety Difficulty Not difficult at all  Not difficult at all Not difficult at all      Physical Exam Constitutional:      Appearance: Normal appearance. She is obese.  HENT:     Head: Normocephalic.  Cardiovascular:     Rate and Rhythm: Normal rate and regular rhythm.  Pulmonary:     Effort: Pulmonary effort is normal.     Breath sounds: Normal breath sounds.  Neurological:     General: No focal deficit present.     Mental Status: She is alert and oriented to person, place, and time.  Psychiatric:        Mood and Affect: Mood normal.      The 10-year ASCVD risk score (Arnett DK, et  al., 2019) is: 0.8%    Assessment & Plan:  .Hopie was seen today for medical management of chronic issues.  Diagnoses and all orders for this visit:  Anxiety and depression -     clonazePAM  (KLONOPIN ) 0.5 MG tablet; Take 1 tablet (0.5 mg total) by mouth 2 (two) times daily. -     CMP14+EGFR  History of prediabetes -     POCT HgB A1C -     CMP14+EGFR  Medication management -     CMP14+EGFR  Hypertension, unspecified type -     CMP14+EGFR  Mild intermittent asthma without complication -     fluticasone -salmeterol (ADVAIR) 100-50 MCG/ACT AEPB; Inhale 1 puff into the lungs 2 (two) times daily.  Screening for diabetes mellitus  Screening for lipid disorders -     Lipid panel  Morbid obesity (HCC) -     Lipid panel -     CMP14+EGFR -     TSH + free T4 -     CBC w/Diff/Platelet -     VITAMIN D  25 Hydroxy (Vit-D Deficiency, Fractures)  Uterine leiomyoma, unspecified location   Assessment & Plan Generalized anxiety disorder and depression PHQ/GAD to goal.  Mood well-managed with Paxil  and Klonopin . - Refilled Klonopin  prescription.  Hypertension Blood pressure well-controlled.  Mild intermittent asthma Managed with Advair. - Sent refill for Advair. - Continue to use Airsupra  for rescue  Pre-diabetes A1c at 5.5, indicating good control. - Ordered fasting labs. - Continue to work on low [place].   General Health Maintenance Discussed importance of healthy lifestyle. - Encouraged healthy diet, regular exercise, and adequate sleep.  Fibroids - managed by GYN.      Return in about 6 months (around 07/12/2024).    Daniesha Driver, PA-C  "

## 2024-01-16 ENCOUNTER — Encounter: Payer: Self-pay | Admitting: Physician Assistant

## 2024-07-11 ENCOUNTER — Ambulatory Visit: Admitting: Physician Assistant
